# Patient Record
Sex: Female | Born: 1980 | Race: Black or African American | Hispanic: No | Marital: Single | Smoking: Former smoker
Health system: Southern US, Community
[De-identification: ages and names within clinical notes are randomized; demographics above are authoritative.]

## PROBLEM LIST (undated history)

## (undated) VITALS — BP 157/97 | HR 80 | Temp 98.3°F | Resp 18 | Ht 65.0 in | Wt 235.0 lb

## (undated) DIAGNOSIS — F419 Anxiety disorder, unspecified: Secondary | ICD-10-CM

## (undated) DIAGNOSIS — F32A Depression, unspecified: Secondary | ICD-10-CM

## (undated) DIAGNOSIS — I10 Essential (primary) hypertension: Secondary | ICD-10-CM

## (undated) DIAGNOSIS — F329 Major depressive disorder, single episode, unspecified: Secondary | ICD-10-CM

## (undated) HISTORY — PX: CHOLECYSTECTOMY: SHX55

## (undated) HISTORY — DX: Essential (primary) hypertension: I10

---

## 2002-09-04 ENCOUNTER — Encounter: Payer: Self-pay | Admitting: Emergency Medicine

## 2002-09-04 ENCOUNTER — Emergency Department (HOSPITAL_COMMUNITY): Admission: EM | Admit: 2002-09-04 | Discharge: 2002-09-04 | Payer: Self-pay | Admitting: Emergency Medicine

## 2007-09-28 ENCOUNTER — Encounter: Admission: RE | Admit: 2007-09-28 | Discharge: 2007-09-28 | Payer: Self-pay | Admitting: Gastroenterology

## 2009-02-01 ENCOUNTER — Other Ambulatory Visit: Admission: RE | Admit: 2009-02-01 | Discharge: 2009-02-01 | Payer: Self-pay | Admitting: *Deleted

## 2011-07-14 ENCOUNTER — Emergency Department (HOSPITAL_BASED_OUTPATIENT_CLINIC_OR_DEPARTMENT_OTHER)
Admission: EM | Admit: 2011-07-14 | Discharge: 2011-07-14 | Disposition: A | Discharge status: Home / Self Care | Payer: No Typology Code available for payment source | Attending: Emergency Medicine | Admitting: Emergency Medicine

## 2011-07-14 ENCOUNTER — Encounter: Payer: Self-pay | Admitting: *Deleted

## 2011-07-14 ENCOUNTER — Emergency Department (INDEPENDENT_AMBULATORY_CARE_PROVIDER_SITE_OTHER): Payer: No Typology Code available for payment source

## 2011-07-14 DIAGNOSIS — M542 Cervicalgia: Secondary | ICD-10-CM

## 2011-07-14 DIAGNOSIS — F17200 Nicotine dependence, unspecified, uncomplicated: Secondary | ICD-10-CM | POA: Insufficient documentation

## 2011-07-14 DIAGNOSIS — S139XXA Sprain of joints and ligaments of unspecified parts of neck, initial encounter: Secondary | ICD-10-CM | POA: Insufficient documentation

## 2011-07-14 DIAGNOSIS — S161XXA Strain of muscle, fascia and tendon at neck level, initial encounter: Secondary | ICD-10-CM

## 2011-07-14 DIAGNOSIS — Y92410 Unspecified street and highway as the place of occurrence of the external cause: Secondary | ICD-10-CM | POA: Insufficient documentation

## 2011-07-14 MED ORDER — CYCLOBENZAPRINE HCL 10 MG PO TABS: 10.0000 mg | ORAL_TABLET | Freq: Three times a day (TID) | ORAL | Status: AC | PRN

## 2011-07-14 MED ORDER — IBUPROFEN 800 MG PO TABS: 800.0000 mg | ORAL_TABLET | Freq: Three times a day (TID) | ORAL | Status: AC
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
## 2012-01-16 ENCOUNTER — Ambulatory Visit: Payer: Self-pay | Admitting: Internal Medicine

## 2012-01-16 VITALS — BP 142/86 | HR 98 | Temp 98.3°F | Resp 18 | Ht 65.0 in | Wt 255.0 lb

## 2012-01-16 DIAGNOSIS — L03319 Cellulitis of trunk, unspecified: Secondary | ICD-10-CM

## 2012-01-16 DIAGNOSIS — L03314 Cellulitis of groin: Secondary | ICD-10-CM

## 2012-01-16 DIAGNOSIS — L02219 Cutaneous abscess of trunk, unspecified: Secondary | ICD-10-CM

## 2012-01-16 MED ORDER — SULFAMETHOXAZOLE-TRIMETHOPRIM 800-160 MG PO TABS: 1.0000 | ORAL_TABLET | Freq: Two times a day (BID) | ORAL | Status: AC

## 2012-01-16 NOTE — Progress Notes (Signed)
  Subjective:    Patient ID: Cheryl Noble, female    DOB: Dec 14, 1980, 31 y.o.   MRN: [id number]  Groin Pain The patient's primary symptoms include vaginal bleeding. The patient's pertinent negatives include no genital itching, genital lesions, genital odor, genital rash, missed menses, pelvic pain or vaginal discharge. This is a new problem. The current episode started in the past 7 days. The problem occurs constantly. The problem has been gradually worsening. The pain is moderate. The problem affects the right side. She is not pregnant. Pertinent negatives include no abdominal pain, back pain, chills, discolored urine, fever, flank pain, rash, urgency or vomiting.  Cheryl Noble comes in today for a painful skin area in her right groin area.  She has had a small lump there for "some time" but in the past week it has become larger and is now painful.  She has not seen any drainage from the sight and has no associated symptoms.  She is on her menses today and denies pregnancy.  She has SSP.  Generally her health is good with no diabetes or other chronic illnesses.    Review of Systems  Constitutional: Negative for fever and chills.  Gastrointestinal: Negative for vomiting and abdominal pain.  Genitourinary: Negative for urgency, flank pain, vaginal discharge, pelvic pain and missed menses.  Musculoskeletal: Negative for back pain.  Skin: Negative for rash.  All other systems reviewed and are negative.       Objective:   Physical Exam  Vitals reviewed. Constitutional: She is oriented to person, place, and time. She appears well-developed and well-nourished.       obese  HENT:  Head: Normocephalic.  Eyes: Conjunctivae are normal.  Cardiovascular: Normal rate, regular rhythm and normal heart sounds.   Pulmonary/Chest: Effort normal and breath sounds normal.  Abdominal: Soft. There is no tenderness.  Genitourinary:    Pelvic exam was performed with patient supine.  Neurological: She is alert  and oriented to person, place, and time.  Skin: Skin is warm and dry.  Psychiatric: She has a normal mood and affect. Her behavior is normal.   Procedure:  I & D or right groin.  Topical lidocaine applied X 10 minutes.  Area prepped and cleansed with betadine and alcohol.  #15 blade used to open wound and copious yellow drainage was expressed.  Wound Cx obtained.  Telfa, gauze and tape dressing applied.  Wound instructions given to pt on AVS.  Septra DS BID for 10 days.  RTC if not resolving or problems arise.       Assessment & Plan:  Celllulits/Abcess or right groin.

## 2012-01-16 NOTE — Patient Instructions (Signed)
Take your Septra DS twice daily with food.  Take a warm bath for the next two days starting tomorrow to encourage drainage of any more infection.  Your wound should heal and the skin close in the next 2-3 days.  Change your dressing at least once daily until no more drainage is seen.  Return to our clinic if you have any other problems.  Cellulitis Cellulitis is an infection of the skin and the tissue beneath it. The area is typically red and tender. It is caused by germs (bacteria) (usually staph or strep) that enter the body through cuts or sores. Cellulitis most commonly occurs in the arms or lower legs.  HOME CARE INSTRUCTIONS   If you are given a prescription for medications which kill germs (antibiotics), take as directed until finished.   If the infection is on the arm or leg, keep the limb elevated as able.   Use a warm cloth several times per day to relieve pain and encourage healing.   See your caregiver for recheck of the infected site as directed if problems arise.   Only take over-the-counter or prescription medicines for pain, discomfort, or fever as directed by your caregiver.  SEEK MEDICAL CARE IF:   The area of redness (inflammation) is spreading, there are red streaks coming from the infected site, or if a part of the infection begins to turn dark in color.   The joint or bone underneath the infected skin becomes painful after the skin has healed.   The infection returns in the same or another area after it seems to have gone away.   A boil or bump swells up. This may be an abscess.   New, unexplained problems such as pain or fever develop.  SEEK IMMEDIATE MEDICAL CARE IF:   You have a fever.   You or your child feels drowsy or lethargic.   There is vomiting, diarrhea, or lasting discomfort or feeling ill (malaise) with muscle aches and pains.  MAKE SURE YOU:   Understand these instructions.   Will watch your condition.   Will get help right away if you are  not doing well or get worse.  Document Released: 08/14/2005 Document Revised: 07/17/2011 Document Reviewed: 06/22/2008 [facility] Patient Information 2012 [place], [place].

## 2012-01-18 LAB — WOUND CULTURE: Gram Stain: NONE SEEN

## 2014-01-11 ENCOUNTER — Ambulatory Visit (HOSPITAL_COMMUNITY): Payer: Self-pay | Admitting: Psychiatry

## 2014-01-14 ENCOUNTER — Encounter (HOSPITAL_COMMUNITY): Payer: Self-pay | Admitting: *Deleted

## 2014-01-14 ENCOUNTER — Inpatient Hospital Stay (HOSPITAL_COMMUNITY)
Admission: AD | Admit: 2014-01-14 | Discharge: 2014-01-17 | DRG: 885 | Disposition: A | Discharge status: Home / Self Care | Payer: 59 | Attending: Psychiatry | Admitting: Psychiatry

## 2014-01-14 DIAGNOSIS — F339 Major depressive disorder, recurrent, unspecified: Secondary | ICD-10-CM | POA: Diagnosis present

## 2014-01-14 DIAGNOSIS — R45851 Suicidal ideations: Secondary | ICD-10-CM

## 2014-01-14 DIAGNOSIS — R634 Abnormal weight loss: Secondary | ICD-10-CM | POA: Diagnosis present

## 2014-01-14 DIAGNOSIS — F411 Generalized anxiety disorder: Secondary | ICD-10-CM | POA: Diagnosis present

## 2014-01-14 DIAGNOSIS — F419 Anxiety disorder, unspecified: Secondary | ICD-10-CM | POA: Diagnosis present

## 2014-01-14 DIAGNOSIS — R630 Anorexia: Secondary | ICD-10-CM | POA: Diagnosis present

## 2014-01-14 DIAGNOSIS — F332 Major depressive disorder, recurrent severe without psychotic features: Principal | ICD-10-CM | POA: Diagnosis present

## 2014-01-14 DIAGNOSIS — IMO0002 Reserved for concepts with insufficient information to code with codable children: Secondary | ICD-10-CM

## 2014-01-14 DIAGNOSIS — Z6839 Body mass index (BMI) 39.0-39.9, adult: Secondary | ICD-10-CM

## 2014-01-14 HISTORY — DX: Major depressive disorder, single episode, unspecified: F32.9

## 2014-01-14 HISTORY — DX: Depression, unspecified: F32.A

## 2014-01-14 HISTORY — DX: Anxiety disorder, unspecified: F41.9

## 2014-01-14 MED ORDER — ACETAMINOPHEN 325 MG PO TABS
650.0000 mg | ORAL_TABLET | Freq: Four times a day (QID) | ORAL | Status: DC | PRN
  Administered 2014-01-15: 650 mg via ORAL
  Filled 2014-01-14: qty 2

## 2014-01-14 MED ORDER — HYDROXYZINE HCL 25 MG PO TABS
25.0000 mg | ORAL_TABLET | Freq: Four times a day (QID) | ORAL | Status: DC | PRN
  Administered 2014-01-15 – 2014-01-17 (×3): 25 mg via ORAL
  Filled 2014-01-14 (×5): qty 1
  Filled 2014-01-14: qty 10

## 2014-01-14 MED ORDER — TRAZODONE HCL 50 MG PO TABS
50.0000 mg | ORAL_TABLET | Freq: Every evening | ORAL | Status: DC | PRN
  Filled 2014-01-14: qty 1

## 2014-01-14 MED ORDER — ENSURE COMPLETE PO LIQD
237.0000 mL | Freq: Every day | ORAL | Status: DC
  Administered 2014-01-14 – 2014-01-16 (×2): 237 mL via ORAL

## 2014-01-14 MED ORDER — ALUM & MAG HYDROXIDE-SIMETH 200-200-20 MG/5ML PO SUSP: 30.0000 mL | ORAL | Status: DC | PRN

## 2014-01-14 MED ORDER — FLUOXETINE HCL 10 MG PO CAPS
10.0000 mg | ORAL_CAPSULE | Freq: Every day | ORAL | Status: DC
  Administered 2014-01-14 – 2014-01-17 (×4): 10 mg via ORAL
  Filled 2014-01-14: qty 7
  Filled 2014-01-14 (×6): qty 1

## 2014-01-14 MED ORDER — MAGNESIUM HYDROXIDE 400 MG/5ML PO SUSP: 30.0000 mL | Freq: Every day | ORAL | Status: DC | PRN

## 2014-01-14 NOTE — Progress Notes (Signed)
[facility] Group Notes:  (Nursing/MHT/Case Management/Adjunct)  Date:  01/14/2014  Time:  8:00 p.m.   Type of Therapy:  Psychoeducational Skills  Participation Level:  Active  Participation Quality:  Appropriate  Affect:  Appropriate  Cognitive:  Appropriate  Insight:  Appropriate  Engagement in Group:  Engaged  Modes of Intervention:  Education  Summary of Progress/Problems: The patient described her day as having been pretty good overall. She indicated that she went to the gym and attended some groups. As a theme for the day, her relapse prevention will include getting more exercise.   Hazle CocaGOODMAN, Lashandra Arauz S 01/14/2014, 10:47 PM
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
## 2014-01-14 NOTE — Progress Notes (Signed)
Nutrition Brief Note  Patient identified on the Malnutrition Screening Tool (MST) Report.  Wt Readings from Last 10 Encounters:  01/14/14 235 lb (106.595 kg)  01/16/12 255 lb (115.667 kg)  07/14/11 251 lb (113.853 kg)   Body mass index is 39.11 kg/(m^2). Patient meets criteria for class II obesity based on current BMI.   Discussed intake PTA with patient and compared to intake presently.  Discussed changes in intake, if any, and encouraged adequate intake of meals and snacks.  Admitted with depression, also c/o decreased appetite and sleep disturbance. Met with pt who reports poor appetite has been going on for months with 15 pound unintended weight loss in the past 3 months. Pt reports sporadic intake, some days she will eat well, other days less. Thinks this is due to stress and depression. Interested in getting Ensure for additional nutrition.   Current diet order is regular and pt is also offered choice of unit snacks mid-morning and mid-afternoon.  Pt is eating as desired.   Labs and medications reviewed.   Nutrition Dx:  Unintended wt change r/t suboptimal oral intake AEB pt report  Interventions:   Discussed the importance of nutrition and encouraged intake of food and beverages.     Supplements: Ensure Complete HS  No additional nutrition interventions warranted at this time. If nutrition issues arise, please consult RD.   Mikey College MS, [place], [place] Pager [phone] After Hours Pager
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
## 2014-01-14 NOTE — Progress Notes (Signed)
Patient ID: Cheryl Noble, female   DOB: [birth year], 33 y.o.   MRN: [id number] Pt states that she has been having increased anxiety and depression over the past couple of months, denies SI/HI/AVH currently, contracts for safety, main stressors-recently finished masters degree and started working, recent break up, c/o decreased appetite and sleep disturbance, states that she needs someone to inform her employer that she will not be at work, notified LCSW, oriented pt to unit and rules, pt was anxious but cooperative during admission process.

## 2014-01-14 NOTE — Progress Notes (Signed)
Patient ID: Cheryl Noble, female   DOB: Jan 03, 1981, 33 y.o.   MRN: [id number] D: Patient reports increased anxiety. Pt reports increase anxiety from her job with upcoming job opening and recent breakup with partner. Pt state "I feel like  I'm not good enough". Pt rates anxiety as 4 on 0-10 scale. Pt denies suicidal /homicidal ideation intent and plan. Pt denies auditory and visual hallucination. Pt attended evening wrap up group and engaged in discussion. Pt denies any needs or concerns. Cooperative with assessment. No acute distressed noted at this time.   A: Met with pt 1:1. Medications administered as prescribed. Writer encouraged pt to discuss feelings. Pt encouraged to come to staff with any questions or concerns.   R: Patient is safe on the unit. She is complaint with medications and denies any adverse reaction. Continue current POC.

## 2014-01-14 NOTE — [facility] Suicide Risk Assessment (Signed)
[facility] INPATIENT:  Family/Significant Other Suicide Prevention Education  Suicide Prevention Education:  Education Completed; Cheryl Noble, Friend, [phone];  has been identified by the patient as the family member/significant other with whom the patient will be residing, and identified as the person(s) who will aid the patient in the event of a mental health crisis (suicidal ideations/suicide attempt).  With written consent from the patient, the family member/significant other has been provided the following suicide prevention education, prior to the and/or following the discharge of the patient.  The suicide prevention education provided includes the following:  Suicide risk factors  Suicide prevention and interventions  National Suicide Hotline telephone number  [facility] assessment telephone number  [facility] Emergency Assistance 911  [facility] and/or Residential Mobile Crisis Unit telephone number  Request made of family/significant other to:  Remove weapons (e.g., guns, rifles, knives), all items previously/currently identified as safety concern.  Friend advised patient does not have access to guns.  Remove drugs/medications (over-the-counter, prescriptions, illicit drugs), all items previously/currently identified as a safety concern.  The family member/significant other verbalizes understanding of the suicide prevention education information provided.  The family member/significant other agrees to remove the items of safety concern listed above.  Cheryl Noble, Cheryl Noble 01/14/2014, 3:35 PM

## 2014-01-14 NOTE — Progress Notes (Signed)
Recreation Therapy Notes  Date: 02.27.2015 Time: 2:45pm Location: [address] Dayroom    Group Topic: Communication, Team Building, Problem Solving  Goal Area(s) Addresses:  Patient will effectively work with peer towards shared goal.  Patient will identify skill used to make activity successful.  Patient will identify how skills used during activity can be used to build healthy support system.   Behavioral Response: Did not attend.   Marykay Lexenise L Cristy Colmenares, LRT/CTRS  Jearl KlinefelterBlanchfield, Jillisa Harris L 01/14/2014 4:23 PM

## 2014-01-15 ENCOUNTER — Inpatient Hospital Stay (HOSPITAL_COMMUNITY)
Admission: AD | Admit: 2014-01-15 | Discharge: 2014-01-15 | Disposition: A | Discharge status: Home / Self Care | Payer: 59 | Source: Home / Self Care | Attending: Psychiatry | Admitting: Psychiatry

## 2014-01-15 ENCOUNTER — Encounter (HOSPITAL_COMMUNITY): Payer: Self-pay | Admitting: Psychiatry

## 2014-01-15 MED ORDER — IBUPROFEN 200 MG PO TABS
400.0000 mg | ORAL_TABLET | ORAL | Status: DC | PRN
  Administered 2014-01-15 – 2014-01-16 (×4): 400 mg via ORAL
  Filled 2014-01-15 (×4): qty 2

## 2014-01-15 NOTE — Progress Notes (Signed)
[facility] Group Notes:  (Nursing/MHT/Case Management/Adjunct)  Date:  01/15/2014  Time:  8:00 p.m.   Type of Therapy:  Psychoeducational Skills  Participation Level:  Minimal  Participation Quality:  Attentive  Affect:  Appropriate  Cognitive:  Appropriate  Insight:  Appropriate  Engagement in Group:  Resistant  Modes of Intervention:  Education  Summary of Progress/Problems: The patient shared in group that she had a good day, but would not provide any further details. Her coping skill is to use less negative talking in her life and to get more exercise.   Hazle CocaGOODMAN, Ledarrius Beauchaine S 01/15/2014, 10:12 PM

## 2014-01-15 NOTE — Progress Notes (Signed)
Writer has observed patient up in the dayroom in a wheelchair watching tv with little interaction with peers.When not in the dayroom she has been observed in her room lying on her bed reading.  She attended group this evening and participated. She is cautious of taking medications but requested ibuprofen for calf pain. She voiced no other complaints, ice packs given to help with pain. She denies si/hi/a/v hallucinations. Safety maintained on unit with 15 min checks.
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
## 2014-01-15 NOTE — [facility] Group Notes (Signed)
[facility] Group Notes:  (Clinical Social Work)  01/15/2014   1:15-2:15PM  Summary of Progress/Problems:   The main focus of today's process group was for the patient to identify ways in which they have sabotaged their own mental health wellness/recovery.  Motivational interviewing and a handout were used to explore the benefits and costs of their self-sabotaging behavior as well as the benefits and costs of changing this behavior.  The Stages of Change were explained to the group using a handout, and it was explained to patients the importance of making specific plans for how to handle self-sabotaging behaviors. The patient expressed she self-sabotages with negative self-talk and isolation/withdrawal.  The benefit of changing this self-defeating behavior would be that she could function better at work, could go through her day not feeling judged, and could actually engage in fun activities.  Type of Therapy:  Process Group  Participation Level:  Active  Participation Quality:  Attentive and Sharing  Affect:  Appropriate  Cognitive:  Appropriate and Oriented  Insight:  Engaged  Engagement in Therapy:  Engaged  Modes of Intervention:  Education, Motivational Interviewing   Cheryl MantleMareida Grossman-Orr, LCSW 01/15/2014, 4:00pm
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
## 2014-01-15 NOTE — H&P (Signed)
  Pt was seen by me today and I agree with the key elements documented in H&P.  

## 2014-01-15 NOTE — Progress Notes (Signed)
D) Pt has attended the program and interacts with her peers. Pt has been complaining of pain in her left leg and was sent to the ED for evaluation. Results were no break or fracture. Affect and mood appear improved. Rates her depression at a 3 and her hopelessness at a 1. Denies SI and HI. Has attended the groups and participates in them. A) Given support and reassurance along with praise. Encouragement given. R) Denies SI and HI.

## 2014-01-16 DIAGNOSIS — F411 Generalized anxiety disorder: Secondary | ICD-10-CM

## 2014-01-16 DIAGNOSIS — R45851 Suicidal ideations: Secondary | ICD-10-CM

## 2014-01-16 NOTE — Progress Notes (Signed)
Psychoeducational Group Note  Date: 01/16/2014 Time:  0930  Group Topic/Focus:  Gratefulness:  The focus of this group is to help patients identify what two things they are most grateful for in their lives. What helps ground them and to center them on their work to their recovery.  Participation Level:  Active  Participation Quality:  Appropriate  Affect:  Appropriate  Cognitive:  Oriented  Insight:  improving  Engagement in Group:  Engaged  Additional Comments:    Cheryl Noble A   

## 2014-01-16 NOTE — [facility] Group Notes (Signed)
[facility] Group Notes:  (Clinical Social Work)  01/16/2014   1:15-2:15PM  Summary of Progress/Problems:  The main focus of today's process group was to   identify the patient's current support system and decide on other supports that can be put in place.  The picture on workbook was used to discuss why additional supports are needed.  An emphasis was placed on using counselor, doctor, therapy groups, 12-step groups, and problem-specific support groups to expand supports.   There was also an extensive discussion about what constitutes a healthy support versus an unhealthy support.  The patient expressed full comprehension of the concepts presented, and agreed that there is a need to add more supports.    Type of Therapy:  Process Group  Participation Level:  Minimal  Participation Quality:  Attentive   Affect:  Blunted  Cognitive:  Appropriate and Oriented  Insight:  Developing/Improving  Engagement in Therapy:  Improving  Modes of Intervention:  Education,  Support and Processing  Ambrose MantleMareida Grossman-Orr, LCSW 01/16/2014, 4:00pm

## 2014-01-16 NOTE — Progress Notes (Signed)
Psychoeducational Group Note  Date: 01/16/2014 Time:  0930  Group Topic/Focus:  Gratefulness:  The focus of this group is to help patients identify what two things they are most grateful for in their lives. What helps ground them and to center them on their work to their recovery.  Participation Level:  Active  Participation Quality:  Appropriate  Affect:  Appropriate  Cognitive:  Oriented  Insight:  improving  Engagement in Group:  Engaged  Additional Comments:    Deandre Stansel A   

## 2014-01-16 NOTE — Progress Notes (Signed)
Patient has been up and active on the unit, attended group this evening and participated. Patient currently denies having pain, -si/hi/a/v hall. She is hopeful to discharge on tomorrow. She has been up walking without the wheelchair today and is doing much better. Support and encouragement given, safety maintained on unit, will continue to monitor.

## 2014-01-16 NOTE — Progress Notes (Signed)
Late Entry for 01-15-14 Entered on 01-16-14  .Psychoeducational Group Note    Date: 01/16/2014 Time: 0930   Goal Setting Purpose of Group: To be able to set a goal that is measurable and that can be accomplished in one day Participation Level:  Active  Participation Quality:  Appropriate  Affect:  Appropriate  Cognitive:  Oriented  Insight:  Improving  Engagement in Group:  Engaged  Additional Comments:    Adrine Hayworth A 

## 2014-01-16 NOTE — Progress Notes (Signed)
Late entry for 01-15-14 Entered on 01-16-14  Psychoeducational Group Note  Date: 01/16/2014 Time:  1015  Group Topic/Focus:  Identifying Needs:   The focus of this group is to help patients identify their personal needs that have been historically problematic and identify healthy behaviors to address their needs.  Participation Level:  Active  Participation Quality:  Appropriate  Affect:  Appropriate  Cognitive:  Oriented  Insight:  Improving  Engagement in Group:  Engaged  Additional Comments:    Willa Brocks A  

## 2014-01-16 NOTE — Progress Notes (Signed)
D) Pt states that she is going to apply for the job at [employer] this week. Feels much more empowered about herself and feels that she wants and should get the position she has been so concerned about. Has attended the groups and interacts with her peers. 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  A) Given support, reassurance and praise. Praised for her willingness to reach out and risk. R) Denies SI and HI.

## 2014-01-16 NOTE — Progress Notes (Signed)
[facility] MD Progress Note  01/16/2014 3:47 PM Cheryl Noble  MRN:  [id number] Subjective:  Cheryl Noble states that she is feeling less depressed today denies current suicidal ideation or plan.  Patient states that SI attempt was impulsive and she should have used coping skills and stress management techniques.  "i know what I should have done, I just didn't access my resources"  Develops safety plan to contact support systems including roommate, and friends.  Continue current treatment plan, no changes made to medication regimen.   Diagnosis:   DSM5:  Trauma-Stressor Disorders:  Posttraumatic Stress Disorder (309.81)  Depressive Disorders:  Major Depressive Disorder - Severe (296.23) Total Time spent with patient: 30 minutes  Axis I: Social Anxiety Axis II: No diagnosis Axis III:  Past Medical History  Diagnosis Date  . Depression   . Anxiety     social anxiety   Axis IV: economic problems and occupational problems Axis V: 51-60 moderate symptoms  ADL's:  Intact  Sleep: Fair  Appetite:  Good  Suicidal Ideation:  Passive SI Homicidal Ideation:  Denies   Psychiatric Specialty Exam: Physical Exam  Constitutional: She is oriented to person, place, and time. She appears well-developed and well-nourished.  HENT:  Head: Normocephalic and atraumatic.  Musculoskeletal: She exhibits tenderness.  Neurological: She is alert and oriented to person, place, and time.  Skin: Skin is warm and dry.    Review of Systems  Constitutional: Negative.   HENT: Negative.   Eyes: Negative.   Respiratory: Negative.   Cardiovascular: Negative.   Gastrointestinal: Negative.   Genitourinary: Negative.   Musculoskeletal: Positive for joint pain and myalgias.  Skin: Negative.   Neurological: Negative.   Endo/Heme/Allergies: Negative.   Psychiatric/Behavioral: Positive for depression.    Blood pressure 135/95, pulse 92, temperature 98.2 F (36.8 C), temperature source Oral, resp. rate 18, height 5\' 5"   (1.651 m), weight 106.595 kg (235 lb), last menstrual period 01/13/2014, SpO2 98.00%.Body mass index is 39.11 kg/(m^2).  General Appearance: Casual and Well Groomed  Eye Contact::  Good  Speech:  Clear and Coherent and Normal Rate  Volume:  Normal  Mood:  Depressed  Affect:  Appropriate  Thought Process:  Coherent and Goal Directed  Orientation:  Full (Time, Place, and Person)  Thought Content:  WDL  Suicidal Thoughts:  Passive SI  Homicidal Thoughts:  No  Memory:  Immediate;   Good Recent;   Good Remote;   Good  good recall  Judgement:  Fair  Insight:  Fair  Psychomotor Activity:  Normal  Concentration:  Good  Recall:  Good  Fund of Knowledge:Good  Language: Good  Akathisia:  No  Handed:  Right  AIMS (if indicated):     Assets:  Communication Skills Desire for Improvement Physical Health Social Support Talents/Skills  Sleep:  Number of Hours: 6.75   Musculoskeletal: Strength & Muscle Tone: abnormal and has slight limp to right leg following injury  Gait & Station: limping following basketball injury 8/28 Patient leans: N/A  Current Medications: Current Facility-Administered Medications  Medication Dose Route Frequency Provider Last Rate Last Dose  . alum & mag hydroxide-simeth (MAALOX/MYLANTA) 200-200-20 MG/5ML suspension 30 mL  30 mL Oral Q4H PRN Nanine Means, NP      . feeding supplement (ENSURE COMPLETE) (ENSURE COMPLETE) liquid 237 mL  237 mL Oral QHS Lavena Bullion, RD   237 mL at 01/14/14 1959  . FLUoxetine (PROZAC) capsule 10 mg  10 mg Oral Daily Nanine Means, NP   10 mg at  01/16/14 0826  . hydrOXYzine (ATARAX/VISTARIL) tablet 25 mg  25 mg Oral Q6H PRN Nanine MeansJamison Rosland Riding, NP   25 mg at 01/15/14 2110  . ibuprofen (ADVIL,MOTRIN) tablet 400 mg  400 mg Oral Q4H PRN Nanine MeansJamison Jmichael Gille, NP   400 mg at 01/16/14 1054  . magnesium hydroxide (MILK OF MAGNESIA) suspension 30 mL  30 mL Oral Daily PRN Nanine MeansJamison Kree Armato, NP      . traZODone (DESYREL) tablet 50 mg  50 mg Oral QHS PRN Nanine MeansJamison  Henna Derderian, NP        Lab Results: No results found for this or any previous visit (from the past 48 hour(s)).  Physical Findings: AIMS: Facial and Oral Movements Muscles of Facial Expression: None, normal Lips and Perioral Area: None, normal Jaw: None, normal Tongue: None, normal,Extremity Movements Upper (arms, wrists, hands, fingers): None, normal Lower (legs, knees, ankles, toes): None, normal, Trunk Movements Neck, shoulders, hips: None, normal, Overall Severity Severity of abnormal movements (highest score from questions above): None, normal Incapacitation due to abnormal movements: None, normal Patient's awareness of abnormal movements (rate only patient's report): No Awareness, Dental Status Current problems with teeth and/or dentures?: No Does patient usually wear dentures?: No  CIWA:    COWS:     Treatment Plan Summary: Daily contact with patient to assess and evaluate symptoms and progress in treatment Medication management  Plan:  Review of chart, vital signs, medications, and notes. 1-Individual and group therapy 2-Medication management for depression and anxiety:  Medications reviewed with the patient and she stated no untoward effects, no changes made 3-Coping skills for depression, anxiety, and pain 4-Continue crisis stabilization and management 5-Address health issues--monitoring vital signs, stable 6-Treatment plan in progress to prevent relapse of depression and anxiety  Medical Decision Making Problem Points:  Established problem, stable/improving (1) and Review of psycho-social stressors (1) Data Points:  Review of medication regiment & side effects (2)  I certify that inpatient services furnished can reasonably be expected to improve the patient's condition.   Nanine MeansLORD, Sway Guttierrez - PM[facility] - NP  01/16/2014, 3:47 PM

## 2014-01-17 DIAGNOSIS — F411 Generalized anxiety disorder: Secondary | ICD-10-CM

## 2014-01-17 DIAGNOSIS — F332 Major depressive disorder, recurrent severe without psychotic features: Principal | ICD-10-CM

## 2014-01-17 MED ORDER — HYDROXYZINE HCL 25 MG PO TABS: 25.0000 mg | ORAL_TABLET | Freq: Four times a day (QID) | ORAL | Status: DC | PRN

## 2014-01-17 MED ORDER — FLUOXETINE HCL 10 MG PO CAPS: 10.0000 mg | ORAL_CAPSULE | Freq: Every day | ORAL | Status: DC

## 2014-01-17 NOTE — [facility] Group Notes (Signed)
[facility]   01/17/2014 11:16 AM    Participation Quality:  Appropraite  Mood/Affect:  Appropriate  Depression Rating:  1  Anxiety Rating:  3  Thoughts of Suicide:  No  Will you contract for safety?   NA  Current AVH:  No  Plan for Discharge/Comments:  Patient attended discharge planning group and actively participated in group.  She will return to her home and [facility] CSW provided all participants with daily workbook.   Transportation Means: Patient has transportation.   Supports:  Patient has a support system.   Cheryl Noble, Cheryl Noble

## 2014-01-17 NOTE — Progress Notes (Signed)
Discharge Note:  Patient discharged home, has keys to her car in parking lot.  Denied SI and HI.   Denied A/V hallucinations.  Denied pain.  Patient received all her belongings, clothing, toiletries, miscellaneous items, keys, sunglasses, cello phone, shoe laces, hair tie, belt, cards.  Suicide prevention information given to patient, reviewed with patient, who stated she understood and had no questions.  Patient stated she [facility].

## 2014-01-17 NOTE — Progress Notes (Addendum)
Patient requested vistaril for anxiety this morning.  Nurse got vistaril out of pyxis, dropped medication on floor.  Another vistaril was obtained from pyxis and given to patient.  Annabelle HarmanDana RN witnessed vistaril which was dropped on floor disposed of properly.  D:  Patient's self inventory sheet, patient denied SI and HI.  Contracts for safety.  Rated depression 1, anxiety 5, hopeless 1.   A:  Medications administered per MD orders.  Emotional support and encouragement given patient. R:  Denied SI and HI.  Denied A/V hallucinations.  Will continue to monitor patient for safety with 15 minute checks.  Safety maintained.

## 2014-01-17 NOTE — Progress Notes (Signed)
[facility] Group Notes:  (Nursing/MHT/Case Management/Adjunct)  Date:  01/17/2014  Time:  8:00 p.m.   Type of Therapy:  Psychoeducational Skills  Participation Level:  Minimal  Participation Quality:  Attentive  Affect:  Appropriate  Cognitive:  Lacking  Insight:  Limited  Engagement in Group:  Poor  Modes of Intervention:  Education  Summary of Progress/Problems: The patient shared with the group this evening that she felt very drowsy today. Her support system will be comprised of her friends and family.   [name] S 01/17/2014, 12:06 AM

## 2014-01-17 NOTE — Tx Team (Signed)
Interdisciplinary Treatment Plan Update   Date Reviewed:  01/17/2014  Time Reviewed:  10:51 AM  Progress in Treatment:   Attending groups: Yes Participating in groups: Yes Taking medication as prescribed: Yes  Tolerating medication: Yes Family/Significant other contact made:  Yes, collateral contact with friend Patient understands diagnosis: Yes  Discussing patient identified problems/goals with staff: Yes Medical problems stabilized or resolved: Yes Denies suicidal/homicidal ideation: Yes Patient has not harmed self or others: Yes  For review of initial/current patient goals, please see plan of care.  Estimated Length of Stay:  Discharge today  Reasons for Continued Hospitalization:   New Problems/Goals identified:    Discharge Plan or Barriers:   Home with [facility]  Additional Comments:  Attendees:  Patient: Cheryl Noble 01/17/2014 10:51 AM   Signature: Mervyn GayJ. Jonnalagadda, MD 01/17/2014 10:51 AM  Signature:   01/17/2014 10:51 AM  Signature:  Claudette Headonrad Withrow, NP 01/17/2014 10:51 AM  Signature:Beverly Terrilee CroakKnight, RN 01/17/2014 10:51 AM  Signature:  Neill Loftarol Davis RN 01/17/2014 10:51 AM  Signature:  Juline PatchQuylle Brynlynn Walko, LCSW 01/17/2014 10:51 AM  Signature:  Reyes Ivanhelsea Horton, LCSW 01/17/2014 10:51 AM  Signature:  Leisa LenzValerie Enoch, Care Coordinator [facility] 01/17/2014 10:51 AM  Signature:  Cephas DarbyXavier Pandimakeel. LCSW  01/17/2014 10:51 AM  Signature: Nadean CorwinKim Maggio, RN 01/17/2014  10:51 AM  Signature:   Onnie BoerJennifer Clark, RN [facility] 01/17/2014  10:51 AM  Signature:  Chrisandra Nettersana Green, RN 01/17/2014  10:51 AM    Scribe for Treatment Team:   Juline PatchQuylle Saim Almanza,  01/17/2014 10:51 AM

## 2014-01-17 NOTE — [facility] Suicide Risk Assessment (Signed)
   Demographic Factors:  Adolescent or young adult  Total Time spent with patient: 30 minutes  Psychiatric Specialty Exam: Physical Exam  Constitutional: She is oriented to person, place, and time. She appears well-developed.  HENT:  Head: Normocephalic.  Eyes: Pupils are equal, round, and reactive to light.  Neck: Normal range of motion.  Cardiovascular: Normal rate.   Respiratory: Effort normal.  GI: Soft.  Musculoskeletal: Normal range of motion.  Neurological: She is alert and oriented to person, place, and time.  Skin: Skin is warm.    Review of Systems  All other systems reviewed and are negative.    Blood pressure 157/97, pulse 80, temperature 98.3 F (36.8 C), temperature source Oral, resp. rate 18, height 5\' 5"  (1.651 m), weight 106.595 kg (235 lb), last menstrual period 01/13/2014, SpO2 98.00%.Body mass index is 39.11 kg/(m^2).  General Appearance: Casual  Eye Contact::  Good  Speech:  Clear and Coherent  Volume:  Normal  Mood:  Euthymic  Affect:  Appropriate and Congruent  Thought Process:  Goal Directed and Intact  Orientation:  Full (Time, Place, and Person)  Thought Content:  WDL  Suicidal Thoughts:  No  Homicidal Thoughts:  No  Memory:  Immediate;   Good  Judgement:  Good  Insight:  Good  Psychomotor Activity:  Normal  Concentration:  Good  Recall:  Good  Fund of Knowledge:Good  Language: Good  Akathisia:  NA  Handed:  Right  AIMS (if indicated):     Assets:  Communication Skills Desire for Improvement Financial Resources/Insurance Housing Leisure Time Physical Health Resilience Social Support Talents/Skills Transportation Vocational/Educational  Sleep:  Number of Hours: 6.75    Musculoskeletal: Strength & Muscle Tone: within normal limits Gait & Station: normal Patient leans: N/A   Mental Status Per Nursing Assessment::   On Admission:  NA  Current Mental Status by Physician: NA  Loss Factors: Loss of significant  relationship  Historical Factors: Family history of mental illness or substance abuse and Impulsivity  Risk Reduction Factors:   Sense of responsibility to family, Religious beliefs about death, Employed, Living with another person, especially a relative, Positive social support, Positive therapeutic relationship and Positive coping skills or problem solving skills  Continued Clinical Symptoms:  Depression:   Recent sense of peace/wellbeing Unstable or Poor Therapeutic Relationship Previous Psychiatric Diagnoses and Treatments  Cognitive Features That Contribute To Risk:  Polarized thinking    Suicide Risk:  Minimal: No identifiable suicidal ideation.  Patients presenting with no risk factors but with morbid ruminations; may be classified as minimal risk based on the severity of the depressive symptoms  Discharge Diagnoses:   AXIS I:  Anxiety Disorder NOS and Major Depression, Recurrent severe AXIS II:  Deferred AXIS III:   Past Medical History  Diagnosis Date  . Depression   . Anxiety     social anxiety   AXIS IV:  other psychosocial or environmental problems, problems related to social environment, problems with primary support group and Broke up with a partner AXIS V:  61-70 mild symptoms  Plan Of Care/Follow-up recommendations:  Activity:  As tolerated Diet:  Regular  Is patient on multiple antipsychotic therapies at discharge:  No   Has Patient had three or more failed trials of antipsychotic monotherapy by history:  No  Recommended Plan for Multiple Antipsychotic Therapies: NA    Cheryl Noble,JANARDHAHA R. 01/17/2014, 1:01 PM

## 2014-01-17 NOTE — [facility] Group Notes (Signed)
[facility]   01/17/2014 12:52 PM    Participation Quality:  Appropraite  Mood/Affect:  Appropriate  Depression Rating:  1  Anxiety Rating:  3  Thoughts of Suicide:  No  Will you contract for safety?   NA  Current AVH:  No  Plan for Discharge/Comments:  Patient attended discharge planning group and actively participated in group.  She plans to return to her home [facility]  CSW provided all participants with daily workbook.   Transportation Means: Patient has transportation.   Supports:  Patient has a support system.   Cheryl Noble, Cheryl Noble
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
## 2014-01-19 NOTE — Progress Notes (Signed)
Patient Discharge Instructions:  After Visit Summary (AVS):   Faxed to:  01/19/14 Discharge Summary Note:   Faxed to:  01/19/14 Psychiatric Admission Assessment Note:   Faxed to:  01/19/14 Suicide Risk Assessment - Discharge Assessment:   Faxed to:  01/19/14 Faxed/Sent to the Next Level Care provider:  01/19/14 Next Level Care Provider Has Access to the EMR, 01/19/14 Records [facility] via [facility]/Epic access Faxed to [facility] @ [phone]  Jerelene ReddenSheena E [place], 01/19/2014, 4:21 PM

## 2014-01-28 ENCOUNTER — Encounter (HOSPITAL_COMMUNITY): Payer: Self-pay | Admitting: Psychiatry

## 2014-01-28 ENCOUNTER — Ambulatory Visit (INDEPENDENT_AMBULATORY_CARE_PROVIDER_SITE_OTHER): Payer: 59 | Admitting: Psychiatry

## 2014-01-28 VITALS — BP 120/80 | HR 78 | Ht 66.0 in | Wt 235.0 lb

## 2014-01-28 DIAGNOSIS — F4310 Post-traumatic stress disorder, unspecified: Secondary | ICD-10-CM

## 2014-01-28 DIAGNOSIS — F332 Major depressive disorder, recurrent severe without psychotic features: Secondary | ICD-10-CM

## 2014-01-28 DIAGNOSIS — E669 Obesity, unspecified: Secondary | ICD-10-CM | POA: Insufficient documentation

## 2014-01-28 DIAGNOSIS — F411 Generalized anxiety disorder: Secondary | ICD-10-CM

## 2014-01-28 DIAGNOSIS — F419 Anxiety disorder, unspecified: Secondary | ICD-10-CM

## 2014-01-28 DIAGNOSIS — F339 Major depressive disorder, recurrent, unspecified: Secondary | ICD-10-CM

## 2014-01-28 MED ORDER — HYDROXYZINE HCL 25 MG PO TABS: 25.0000 mg | ORAL_TABLET | Freq: Four times a day (QID) | ORAL | Status: DC | PRN

## 2014-01-28 MED ORDER — FLUOXETINE HCL 10 MG PO CAPS: 10.0000 mg | ORAL_CAPSULE | Freq: Every day | ORAL | Status: DC
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
## 2014-01-31 ENCOUNTER — Ambulatory Visit (HOSPITAL_COMMUNITY): Payer: Self-pay | Admitting: Psychiatry

## 2014-02-07 ENCOUNTER — Telehealth (HOSPITAL_COMMUNITY): Payer: Self-pay | Admitting: *Deleted

## 2014-02-07 NOTE — Telephone Encounter (Signed)
Refill not appropriate filled 3/13 with 2 refills, pharmacy was notified.

## 2014-02-09 ENCOUNTER — Telehealth: Payer: Self-pay | Admitting: *Deleted

## 2014-03-31 ENCOUNTER — Other Ambulatory Visit (HOSPITAL_COMMUNITY): Payer: Self-pay | Admitting: *Deleted

## 2014-03-31 MED ORDER — HYDROXYZINE HCL 25 MG PO TABS: 25.0000 mg | ORAL_TABLET | Freq: Four times a day (QID) | ORAL | Status: DC | PRN

## 2014-05-02 ENCOUNTER — Ambulatory Visit (HOSPITAL_COMMUNITY): Payer: Self-pay | Admitting: Psychiatry

## 2014-05-03 ENCOUNTER — Ambulatory Visit (INDEPENDENT_AMBULATORY_CARE_PROVIDER_SITE_OTHER): Payer: 59 | Admitting: Psychiatry

## 2014-05-03 ENCOUNTER — Encounter (HOSPITAL_COMMUNITY): Payer: Self-pay | Admitting: Psychiatry

## 2014-05-03 VITALS — BP 158/98 | HR 68 | Ht 66.0 in | Wt 224.4 lb

## 2014-05-03 DIAGNOSIS — F331 Major depressive disorder, recurrent, moderate: Secondary | ICD-10-CM

## 2014-05-03 MED ORDER — FLUOXETINE HCL 20 MG PO CAPS: 20.0000 mg | ORAL_CAPSULE | Freq: Every day | ORAL | Status: DC

## 2014-05-03 MED ORDER — HYDROXYZINE HCL 10 MG PO TABS: 10.0000 mg | ORAL_TABLET | Freq: Four times a day (QID) | ORAL | Status: DC | PRN

## 2014-05-03 NOTE — Progress Notes (Signed)
   [facility]  Cheryl Noble January 19, 1981  Date:  05/03/14  Subjective: pt is here for follow up.  Sleeping is good; appetite is fair to poor. Mood is good. She denies SI/HI/AVH. Tolerating the medications. Depression 0/10, Anxiety 7/10. She like hydroxyzine, but wants to try 10 mg po QD. She feels dependent on the medication. Pt is well groomed, just got hair done. She appears still constricted.   There were no vitals filed for this visit.  Mental Status Examination  Appearance: casual  Alert: Yes Attention: fair  Cooperative: Yes Eye Contact: Fair Speech: slow  Psychomotor Activity: Psychomotor Retardation Memory/Concentration: fair  Oriented: time/date, situation and day of week Mood: Anxious, Depressed and Dysphoric Affect: Appropriate and Congruent Thought Processes and Associations: Goal Directed and Linear Fund of Knowledge: Fair Thought Content: preoccupations Insight: Fair Judgement: Fair  Diagnosis:  MDD, recurrent, moderate   Treatment Plan:  Rtc in 4 months Fluoxetine 20 mg po daily for depression Hydroxyzine 10 mg po tid prn anxiety Kendrick FriesBLANKMANN, MEGHAN, NP

## 2014-09-05 ENCOUNTER — Ambulatory Visit (HOSPITAL_COMMUNITY): Payer: Self-pay | Admitting: Psychiatry

## 2014-09-15 ENCOUNTER — Other Ambulatory Visit (HOSPITAL_COMMUNITY): Payer: Self-pay | Admitting: *Deleted

## 2014-09-15 DIAGNOSIS — F331 Major depressive disorder, recurrent, moderate: Secondary | ICD-10-CM

## 2014-09-15 MED ORDER — FLUOXETINE HCL 20 MG PO CAPS: 20.0000 mg | ORAL_CAPSULE | Freq: Every day | ORAL | Status: DC

## 2014-09-15 NOTE — Telephone Encounter (Signed)
Chart reviewed. Refill appropriate per Dr. Lucianne MussKumar. Patient has appt 11/3 with new provider in office - as previous provider left.

## 2014-09-16 ENCOUNTER — Telehealth (HOSPITAL_COMMUNITY): Payer: Self-pay | Admitting: *Deleted

## 2014-09-16 NOTE — Telephone Encounter (Signed)
Called stating her pharmacy does not have refill on file and she needs it today. Called [facility] and they state a fax was not received. Explained it was sent through escripts, they looked it up and apologized stating it will be filled today. Patient notified.

## 2014-09-20 ENCOUNTER — Encounter (HOSPITAL_COMMUNITY): Payer: Self-pay | Admitting: Psychiatry

## 2014-09-20 ENCOUNTER — Ambulatory Visit (INDEPENDENT_AMBULATORY_CARE_PROVIDER_SITE_OTHER): Payer: 59 | Admitting: Psychiatry

## 2014-09-20 VITALS — BP 162/98 | HR 72 | Ht 66.0 in | Wt 235.4 lb

## 2014-09-20 DIAGNOSIS — F4010 Social phobia, unspecified: Secondary | ICD-10-CM

## 2014-09-20 DIAGNOSIS — F4310 Post-traumatic stress disorder, unspecified: Secondary | ICD-10-CM

## 2014-09-20 DIAGNOSIS — F331 Major depressive disorder, recurrent, moderate: Secondary | ICD-10-CM | POA: Insufficient documentation

## 2014-09-20 MED ORDER — HYDROXYZINE HCL 25 MG PO TABS: 25.0000 mg | ORAL_TABLET | Freq: Four times a day (QID) | ORAL | Status: DC | PRN

## 2014-09-20 MED ORDER — FLUOXETINE HCL 20 MG PO CAPS: 20.0000 mg | ORAL_CAPSULE | Freq: Every day | ORAL | Status: DC

## 2014-09-20 NOTE — Progress Notes (Signed)
[facility] [phone] Progress Note  Cheryl Noble [id number] 32 y.o.  09/20/2014 9:56 AM  Chief Complaint: been doing great  History of Present Illness: Denies depression.  Things are good at home and work. Sleep, appetite and energy are good. Pt exercised 3x/week. Denies isolation, crying spells, anhedonias, worthlessness and hopelessness.   Anxiety still present but meds help. It often centered around going out or going to work.Pt doesn't trust anyone.  Pt avoids crowds whenever possible. Pt takes Vistaril 1 hr before going out. It helps to decrease her anxiety level and allows her to do what she needs to do. Denies panic attacks. Anxiety does not interfere with sleep, cause fatigue and GI upset.   PTSD- overall well controlled and no concerns at this time. Denies nightmares, intrusive memories and avoidance.   Takes Prozac and Vistaril as prescribed and denise SE.   Suicidal Ideation: No Plan Formed: No Patient has means to carry out plan: No  Homicidal Ideation: No Plan Formed: No Patient has means to carry out plan: No  Review of Systems: Psychiatric: Agitation: No Hallucination: No Depressed Mood: No Insomnia: No Hypersomnia: No Altered Concentration: No Feels Worthless: No Grandiose Ideas: No Belief In Special Powers: No New/Increased Substance Abuse: No Compulsions: No  Neurologic: Headache: No Seizure: No Paresthesias: No   Review of Systems  Constitutional: Negative for fever and chills.  HENT: Negative for congestion, nosebleeds and sore throat.   Eyes: Negative for blurred vision, double vision and redness.  Respiratory: Negative for cough, sputum production and shortness of breath.   Cardiovascular: Positive for chest pain. Negative for palpitations and leg swelling.  Gastrointestinal: Negative for heartburn, nausea, vomiting, abdominal pain, diarrhea and constipation.  Musculoskeletal: Negative for myalgias, back pain and neck pain.  Skin:  Negative for itching and rash.  Neurological: Negative for dizziness, tingling, sensory change, seizures, weakness and headaches.  Psychiatric/Behavioral: Negative for depression, suicidal ideas, hallucinations and substance abuse. The patient is nervous/anxious. The patient does not have insomnia.      Past Medical Family, Social History:Pt lives alone/  Pt works as a Comptrollerlibrarian. 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   reports that she quit smoking about 2 weeks ago. Her smoking use included E-cigarettes. She smoked 0.00 packs per day. She has never used smokeless tobacco. She reports that she drinks about 1.8 oz of alcohol per week. She reports that she does not use illicit drugs. Family History  Problem Relation Age of Onset  . Bipolar disorder Neg Hx   . Depression Neg Hx   . Dementia Neg Hx    Past Medical History  Diagnosis Date  . Depression   . Anxiety     social anxiety    Outpatient Encounter Prescriptions as of 09/20/2014  Medication Sig  . FLUoxetine (PROZAC) 20 MG capsule Take 1 capsule (20 mg total) by mouth daily.  . hydrOXYzine (ATARAX/VISTARIL) 10 MG tablet Take 1 tablet (10 mg total) by mouth every 6 (six) hours as needed for anxiety.    Past Psychiatric History/Hospitalization(s): Anxiety: Yes Bipolar Disorder: No Depression: Yes Mania: No Psychosis: No Schizophrenia: No Personality Disorder: No Hospitalization for psychiatric illness: Yes History of Electroconvulsive Shock Therapy: No Prior Suicide Attempts: Yes  Physical Exam: Constitutional:  BP 158/106 mmHg  Pulse 72  Ht 5\' 6"  (1.676 m)  Wt 235 lb 6.4 oz (106.777 kg)  BMI 38.01 kg/m2  General Appearance: alert, oriented, no acute distress and well nourished  Musculoskeletal: Strength & Muscle Tone: within  normal limits Gait & Station: normal Patient leans: N/A  Mental Status Examination/Evaluation: Objective: Attitude: Calm and cooperative  Appearance: Fairly Groomed, appears to  be stated age  Eye Contact::  Good  Speech:  Clear and Coherent and Normal Rate  Volume:  Normal  Mood:  euthymic  Affect:  Full Range  Thought Process:  Goal Directed, Linear and Logical  Orientation:  Full (Time, Place, and Person)  Thought Content:  Negative  Suicidal Thoughts:  No  Homicidal Thoughts:  No  Judgement:  Good  Insight:  Good  Concentration: good  Memory: Immediate-good Recent-good Remote-good  Recall: fair  Language: fair  Gait and Station: normal  [employer] of Knowledge: average  Psychomotor Activity:  Normal  Akathisia:  No  Handed:  Right  AIMS (if indicated): n/a  Assets:  Manufacturing systems engineerCommunication Skills Desire for Improvement Financial Resources/Insurance Housing Intimacy Leisure Time Physical Health Resilience Social Support TEFL teacherTalents/Skills Transportation       Medical Decision Making (Choose Three): Established Problem, Stable/Improving (1), Review of Psycho-Social Stressors (1) and Review of Medication Regimen & Side Effects (2)  Assessment: Axis I: MDD- recurrent, 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 Depression   . Anxiety     social anxiety     Axis IV: limited coping skills  Axis V: GAF 70   Plan: Continue Prozac 20 mg po QD for mood and anxiety Hydroxyzine 25mg  QID prn anxiety  Medication management with supportive therapy. Risks/benefits and SE of the medication discussed. Pt verbalized understanding and verbal consent obtained for treatment.  Affirm with the patient that the medications are taken as ordered. Patient expressed understanding of how their medications were to be used.  -improvement of symptoms  Labs: none at this time  Therapy: brief supportive therapy provided. Discussed psychosocial stressors in detail.    Pt denies SI and is at an acute low risk for suicide.Patient told to call clinic if any problems occur.  Patient advised to go to ER if they should develop SI/HI, side effects, or if symptoms worsen. Has crisis numbers to call if needed. Pt verbalized understanding.  F/up in 3 months or sooner if needed  Oletta DarterAGARWAL, Kessler Solly, MD 09/20/2014

## 2014-12-22 ENCOUNTER — Ambulatory Visit (INDEPENDENT_AMBULATORY_CARE_PROVIDER_SITE_OTHER): Payer: 59 | Admitting: Psychiatry

## 2014-12-22 ENCOUNTER — Encounter (HOSPITAL_COMMUNITY): Payer: Self-pay | Admitting: Psychiatry

## 2014-12-22 ENCOUNTER — Ambulatory Visit (HOSPITAL_COMMUNITY): Payer: Self-pay | Admitting: Psychiatry

## 2014-12-22 VITALS — BP 141/105 | HR 87 | Ht 65.5 in | Wt 248.2 lb

## 2014-12-22 DIAGNOSIS — F332 Major depressive disorder, recurrent severe without psychotic features: Secondary | ICD-10-CM

## 2014-12-22 DIAGNOSIS — F331 Major depressive disorder, recurrent, moderate: Secondary | ICD-10-CM

## 2014-12-22 DIAGNOSIS — F418 Other specified anxiety disorders: Secondary | ICD-10-CM

## 2014-12-22 DIAGNOSIS — F4010 Social phobia, unspecified: Secondary | ICD-10-CM

## 2014-12-22 DIAGNOSIS — F4310 Post-traumatic stress disorder, unspecified: Secondary | ICD-10-CM

## 2014-12-22 MED ORDER — FLUOXETINE HCL 40 MG PO CAPS: 40.0000 mg | ORAL_CAPSULE | Freq: Every day | ORAL | Status: DC

## 2014-12-22 MED ORDER — HYDROXYZINE HCL 25 MG PO TABS: 25.0000 mg | ORAL_TABLET | Freq: Four times a day (QID) | ORAL | Status: DC | PRN

## 2014-12-22 NOTE — Progress Notes (Signed)
[facility] [id number] Progress Note  Cheryl Noble [id number] 33 y.o.  12/22/2014 10:35 AM  Chief Complaint: concentration is bad  History of Present Illness: States concentration is getting worse for last 5 months or so. Reports she loses track in conversations (almost daily) or forgetting why she is going to a room (rarely). No problems at work or retaining information while reading.   Pt is having increased stressed due to moving with her friend. She felt down twice for a few hours in the last 3 months. On those days she felt sad and cried a lot. Appetite is increased.  Sleep and energy are good. Pt exercised 3x/week. Denies isolation, crying spells, anhedonias, worthlessness and hopelessness for the most part.   Anxiety is present but managable. It often centered around going out or going to work. Pt doesn't trust anyone.  Pt doesn't like crowds but doesn't avoid it as much. Pt takes Vistaril 1 hr before going out and before going to work. It helps to decrease her anxiety level and allows her to do what she needs to do. Denies panic attacks. Anxiety does not interfere with sleep, cause fatigue and GI upset.   PTSD- overall well controlled and no concerns at this time. States she doesn't notice it. Denies nightmares, intrusive memories and avoidance.   Takes Prozac and Vistaril as prescribed and denise SE.   Suicidal Ideation: No Plan Formed: No Patient has means to carry out plan: No  Homicidal Ideation: No Plan Formed: No Patient has means to carry out plan: No  Review of Systems: Psychiatric: Agitation: No Hallucination: No Depressed Mood: No Insomnia: No Hypersomnia: No Altered Concentration: Yes Feels Worthless: No Grandiose Ideas: No Belief In Special Powers: No New/Increased Substance Abuse: No Compulsions: No  Neurologic: Headache: No Seizure: No Paresthesias: No   Review of Systems  Constitutional: Negative for fever, chills and weight loss.  HENT:  Negative for congestion and sore throat.   Eyes: Negative for blurred vision, double vision and redness.  Respiratory: Negative for cough, shortness of breath and wheezing.   Cardiovascular: Positive for palpitations. Negative for chest pain and leg swelling.  Gastrointestinal: Negative for heartburn, nausea, vomiting and abdominal pain.  Musculoskeletal: Positive for back pain. Negative for myalgias, joint pain and neck pain.  Skin: Negative for itching and rash.  Neurological: Negative for speech change, focal weakness, seizures, loss of consciousness and headaches.  Psychiatric/Behavioral: Negative for depression, suicidal ideas, hallucinations and substance abuse. The patient is nervous/anxious. The patient does not have insomnia.      Past Medical Family, Social History:  Pt works as a Comptrollerlibrarian. 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 Living with girlfriend.   reports that she quit smoking about 3 months ago. Her smoking use included E-cigarettes. She smoked 0.00 packs per day. She has never used smokeless tobacco. She reports that she drinks about 1.8 oz of alcohol per week. She reports that she does not use illicit drugs. Family History  Problem Relation Age of Onset  . Bipolar disorder Neg Hx   . Depression Neg Hx   . Dementia Neg Hx    Past Medical History  Diagnosis Date  . Depression   . Anxiety     social anxiety  . Hypertension     Outpatient Encounter Prescriptions as of 12/22/2014  Medication Sig  . amLODipine (NORVASC) 5 MG tablet Take 5 mg by mouth daily.  Marland Kitchen. FLUoxetine (PROZAC) 20 MG capsule Take 1 capsule (20 mg total) by mouth  daily.  . hydrOXYzine (ATARAX/VISTARIL) 25 MG tablet Take 1 tablet (25 mg total) by mouth every 6 (six) hours as needed for anxiety.    Past Psychiatric History/Hospitalization(s): Anxiety: Yes Bipolar Disorder: No Depression: Yes Mania: No Psychosis: No Schizophrenia: No Personality Disorder: No Hospitalization for  psychiatric illness: Yes History of Electroconvulsive Shock Therapy: No Prior Suicide Attempts: Yes  Physical Exam: Constitutional:  BP 141/105 mmHg  Pulse 87  Ht 5' 5.5" (1.664 m)  Wt 248 lb 3.2 oz (112.583 kg)  BMI 40.66 kg/m2  General Appearance: alert, oriented, no acute distress and well nourished  Musculoskeletal: Strength & Muscle Tone: within normal limits Gait & Station: normal Patient leans: N/A  Mental Status Examination/Evaluation: Objective: Attitude: Calm and cooperative  Appearance: Fairly Groomed, appears to be stated age  Eye Contact::  Good  Speech:  Clear and Coherent and Normal Rate  Volume:  Normal  Mood:  anxious  Affect:  Congruent  Thought Process:  Goal Directed, Linear and Logical  Orientation:  Full (Time, Place, and Person)  Thought Content:  Negative  Suicidal Thoughts:  No  Homicidal Thoughts:  No  Judgement:  Good  Insight:  Good  Concentration: good  Memory: Immediate-good Recent-good Remote-good  Recall: fair  Language: fair  Gait and Station: normal  [employer] of Knowledge: average  Psychomotor Activity:  Normal  Akathisia:  No  Handed:  Right  AIMS (if indicated): n/a  Assets:  Manufacturing systems engineer Desire for Improvement Financial Resources/Insurance Housing Intimacy Leisure Time Physical Health Resilience Social Support TEFL teacher (Choose Three): Established Problem, Stable/Improving (1), Review of Psycho-Social Stressors (1), Established Problem, Worsening (2), Review of Medication Regimen & Side Effects (2) and Review of New Medication or Change in Dosage (2)  Assessment: Axis I: MDD- recurrent, 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 Depression   . Anxiety     social anxiety  . Hypertension      Axis IV: limited coping skills  Axis V: GAF 70   Plan:  Increase Prozac to 40 mg po QD for mood and anxiety Hydroxyzine  QID prn anxiety  Medication management with supportive therapy. Risks/benefits and SE of the medication discussed. Pt verbalized understanding and verbal consent obtained for treatment.  Affirm with the patient that the medications are taken as ordered. Patient expressed understanding of how their medications were to be used.  -improvement of depression symptoms but worsening of anxiety. Concentration issues could be SE of Vistaril. Pt wants to continue for now. May consider Propanolol at next visit.   Labs: none at this time  Therapy: brief supportive therapy provided. Discussed psychosocial stressors in detail.    Pt denies SI and is at an acute low risk for suicide.Patient told to call clinic if any problems occur. Patient advised to go to ER if they should develop SI/HI, side effects, or if symptoms worsen. Has crisis numbers to call if needed. Pt verbalized understanding.  F/up in 3 months or sooner if needed  Oletta Darter, MD 12/22/2014

## 2015-01-13 ENCOUNTER — Telehealth (HOSPITAL_COMMUNITY): Payer: Self-pay

## 2015-01-13 NOTE — Telephone Encounter (Signed)
Patient called with report she was recently increased to Prozac 40mg  per day by Dr. Michae KavaAgarwal and that her PCP changed her over to Propranolol, not sure of dosage for her "heart and blood pressure".  Stated she stopped taking her prescribed Amlodipine but since changes had been feeling "more depressed and tired".  Patient reported she was scheduled to work today from 4-9pm, Saturday from 8:30a-6:00pm and Sunday from 1:30p-6:00p at [employer] [place] and requests advice from Dr. Michae KavaAgarwal and possible note to miss work this weekend due to feeling she just needs "to get caught up on sleep and to help with my depression".  Informed patient Dr. Michae KavaAgarwal only works on Tuesdays and Thursdays and is not here today but will send a note to see if she provides any response today but informed could not guarantee this currently.  Patient not suicidal or having homicidal ideations currently but just reports more depressed and tired since increase in Prozac and changes in medications.  Discussed FMLA potential for patient to see as needed and option and agreed to attempt to contact Dr. Michae KavaAgarwal for feedback today.  Patient requests a possible note to excuse her from work this weekend if she "just cannot handle it".  Patient stated her boss allowed her to leave work on 01/12/15 early due to crying on the job.

## 2015-01-16 NOTE — Telephone Encounter (Signed)
Telephone call with Dr. Michae KavaAgarwal late Friday evening 01/13/15 to inform of patient's request to be excused from work for the coming weekend due to recent changes in medications, increased crying spells and reported depression.  Informed patient denied any suicidal or homicidal ideations but reported her emotions had been fluctuating more since medication changes by Dr. Michae KavaAgarwal and change to propranolol by her primary care provider.  Dr. Michae KavaAgarwal reported recently seeing patient and would not authorize taking her out of work with such short notice and no current evaluation.  Agreed to inform patient she could make an appointment to be seen earlier to come in and discuss with patient.  Attempted to contact patient back that Friday night but no answer.  Left a voice message with instruction on 01/15/15 and requested patient call the office to come in for an earlier evaluation and to discuss work with Dr. Michae KavaAgarwal.  Informed Dr. Michae KavaAgarwal I had also instructed the patient on possible FMLA assistance she could file for with her job.

## 2015-01-17 ENCOUNTER — Ambulatory Visit (INDEPENDENT_AMBULATORY_CARE_PROVIDER_SITE_OTHER): Payer: 59 | Admitting: Psychiatry

## 2015-01-17 ENCOUNTER — Encounter (HOSPITAL_COMMUNITY): Payer: Self-pay | Admitting: Psychiatry

## 2015-01-17 VITALS — BP 147/101 | HR 59 | Ht 65.5 in | Wt 248.0 lb

## 2015-01-17 DIAGNOSIS — F331 Major depressive disorder, recurrent, moderate: Secondary | ICD-10-CM

## 2015-01-17 DIAGNOSIS — F4310 Post-traumatic stress disorder, unspecified: Secondary | ICD-10-CM

## 2015-01-17 DIAGNOSIS — F418 Other specified anxiety disorders: Secondary | ICD-10-CM

## 2015-01-17 DIAGNOSIS — F4010 Social phobia, unspecified: Secondary | ICD-10-CM

## 2015-01-17 DIAGNOSIS — F332 Major depressive disorder, recurrent severe without psychotic features: Secondary | ICD-10-CM

## 2015-01-17 MED ORDER — FLUOXETINE HCL 20 MG PO CAPS: 20.0000 mg | ORAL_CAPSULE | Freq: Every day | ORAL | Status: DC

## 2015-01-17 NOTE — Telephone Encounter (Signed)
Patient came in today for her appointment

## 2015-01-17 NOTE — Progress Notes (Signed)
[facility] [id number] Progress Note  Cheryl Lambertracy Yoss [id number] 34 y.o.  01/17/2015 9:27 AM  Chief Complaint: not sleeping since increasing Prozac  History of Present Illness: Pt is getting about 3 hrs of poor sleep since dose of Prozac was increased. Also noticing decrease in libido and sad mood. It began a couple of days after dose of Prozac was increased.  She has been having daily sad mood and irritability. It seems mostly related to work. Over the weekend she was having on/off crying spells and some anhedonia. Denies isolation and worthlessness. Appetite is increased and energy is low. She is exercising about 1-2x/week. Concentration has improved since stopping Vistaril.   Feels she is more stressed and depressed. She is having more crying spells. On Thursday she was felt overwhelmed and depressed and asked to leave work early. Pt did not go in on Friday thru Sunday. Pt was able to sleep and relax over the weekend. Pt went back to work on Monday and it was ok.  Pt was able to sleep all thru the night last night after taking Prozac in the morning vs midday.  Anxiety is present but managable and might be a little decreased It often centered around going out or going to work. Pt doesn't trust anyone.  Pt doesn't like crowds but doesn't avoid it as much.. Denies panic attacks. Anxiety does not interfere with sleep, cause fatigue and GI upset. Pt has been taking Propanolol for 1 week and it helps a little with anxiety.   PTSD- overall well controlled and no concerns at this time. States she doesn't notice it and hasn't thought about it for years. Denies nightmares, intrusive memories and avoidance.   Takes Prozac as prescribed and endorsing SE of low libido.   Suicidal Ideation: No Plan Formed: No Patient has means to carry out plan: No  Homicidal Ideation: No Plan Formed: No Patient has means to carry out plan: No  Review of Systems: Psychiatric: Agitation: No Hallucination:  No Depressed Mood: Yes Insomnia: Yes Hypersomnia: No Altered Concentration: Yes Feels Worthless: No Grandiose Ideas: No Belief In Special Powers: No New/Increased Substance Abuse: No Compulsions: No  Neurologic: Headache: Yes Seizure: No Paresthesias: No   Review of Systems  Constitutional: Negative for fever, chills and weight loss.  HENT: Negative for congestion, nosebleeds and sore throat.   Eyes: Negative for blurred vision, double vision and redness.  Respiratory: Negative for cough, shortness of breath and wheezing.   Cardiovascular: Negative for chest pain, palpitations and leg swelling.  Gastrointestinal: Negative for heartburn, nausea, vomiting and abdominal pain.  Musculoskeletal: Positive for back pain. Negative for myalgias, joint pain and neck pain.  Skin: Negative for itching and rash.  Neurological: Positive for dizziness and headaches. Negative for focal weakness, seizures, loss of consciousness and weakness.  Psychiatric/Behavioral: Positive for depression. Negative for suicidal ideas, hallucinations and substance abuse. The patient is nervous/anxious and has insomnia.      Past Medical Family, Social History:  Pt works as a Comptrollerlibrarian. 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 Living with girlfriend.   reports that she quit smoking about 4 months ago. Her smoking use included E-cigarettes. She smoked 0.00 packs per day. She has never used smokeless tobacco. She reports that she drinks about 1.8 oz of alcohol per week. She reports that she does not use illicit drugs. Family History  Problem Relation Age of Onset  . Bipolar disorder Neg Hx   . Depression Neg Hx   . Dementia Neg  Hx    Past Medical History  Diagnosis Date  . Depression   . Anxiety     social anxiety  . Hypertension     Outpatient Encounter Prescriptions as of 01/17/2015  Medication Sig  . amLODipine (NORVASC) 5 MG tablet Take 5 mg by mouth daily.  Marland Kitchen FLUoxetine (PROZAC) 40 MG capsule  Take 1 capsule (40 mg total) by mouth daily.  . propranolol (INDERAL) 10 MG tablet Take 10 mg by mouth 2 (two) times daily.  . hydrOXYzine (ATARAX/VISTARIL) 25 MG tablet Take 1 tablet (25 mg total) by mouth every 6 (six) hours as needed for anxiety. (Patient not taking: Reported on 01/17/2015)    Past Psychiatric History/Hospitalization(s): Anxiety: Yes Bipolar Disorder: No Depression: Yes Mania: No Psychosis: No Schizophrenia: No Personality Disorder: No Hospitalization for psychiatric illness: Yes History of Electroconvulsive Shock Therapy: No Prior Suicide Attempts: Yes  Physical Exam: Constitutional:  BP 147/101 mmHg  Pulse 59  Ht 5' 5.5" (1.664 m)  Wt 248 lb (112.492 kg)  BMI 40.63 kg/m2  General Appearance: alert, oriented, no acute distress and well nourished  Musculoskeletal: Strength & Muscle Tone: within normal limits Gait & Station: normal Patient leans: N/A  Mental Status Examination/Evaluation: Objective: Attitude: Calm and cooperative  Appearance: Fairly Groomed, appears to be stated age  Eye Contact::  Good  Speech:  Clear and Coherent and Normal Rate  Volume:  Normal  Mood: depressed  Affect:  Congruent  Thought Process:  Goal Directed, Linear and Logical  Orientation:  Full (Time, Place, and Person)  Thought Content:  Negative  Suicidal Thoughts:  No  Homicidal Thoughts:  No  Judgement:  Good  Insight:  Good  Concentration: good  Memory: Immediate-good Recent-good Remote-good  Recall: fair  Language: fair  Gait and Station: normal  [employer] of Knowledge: average  Psychomotor Activity:  Normal  Akathisia:  No  Handed:  Right  AIMS (if indicated): n/a  Assets:  Manufacturing systems engineer Desire for Improvement Financial Resources/Insurance Housing Intimacy Leisure Time Physical Health Resilience Social Support TEFL teacher (Choose Three): Established Problem, Stable/Improving (1),  Review of Psycho-Social Stressors (1), Established Problem, Worsening (2), Review of Medication Regimen & Side Effects (2) and Review of New Medication or Change in Dosage (2)  Assessment: Axis I: MDD- recurrent, 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 Depression   . Anxiety     social anxiety  . Hypertension    Axis IV: limited coping skills  Axis V: GAF 70   Plan: decrease Prozac to 20 mg po qD for mood and anxiety. If no improvement then will consider treatment with SNRI D/c Vistaril Propanolol as prescibed by PCP for anxiety  Medication management with supportive therapy. Risks/benefits and SE of the medication discussed. Pt verbalized understanding and verbal consent obtained for treatment.  Affirm with the patient that the medications are taken as ordered. Patient expressed understanding of how their medications were to be used.  -improvement of anxiety symptoms but worsening of depression.   Labs: none at this time  Therapy: brief supportive therapy provided. Discussed psychosocial stressors in detail.    Pt denies SI and is at an acute low risk for suicide.Patient told to call clinic if any problems occur. Patient advised to go to ER if they should develop SI/HI, side effects, or if symptoms worsen. Has crisis numbers to  call if needed. Pt verbalized understanding.  F/up in 2 months or sooner if needed.  Pt asking for letter  January 17, 2015  Patient:  Cheryl Noble Date of Birth:  [birth year] Date of Visit:  01/17/2015  To Whom it Noble Concern:  Cheryl Noble was evaluated on 01/17/2015 by Dr. Oletta Darter.  Pt was last seen on 12/22/2014 and that time her medications were adjusted. Ms. Cheryl Noble reported changes in her sleep which could have been related to the medication. She reports sleep changes affected her overall performance. Today we have addressed these concerns and  She   Noble return to work on 01/17/2015 without restrictions.  If you require additional information, please contact [phone] to obtain written authorization to release protected health information.  [employer], building services do not permit release of information without signed authorization.  Cheryl Noble Noble stop by the hospital to complete an Authorization to Release Protected Health Information.  Thank you for your cooperation in this regard.  If you have any questions or concerns, please don't hesitate to call.  Sincerely,    Dr. Ardeth Sportsman, MD 01/17/2015

## 2015-03-23 ENCOUNTER — Encounter (HOSPITAL_COMMUNITY): Payer: Self-pay | Admitting: Psychiatry

## 2015-03-23 ENCOUNTER — Ambulatory Visit (HOSPITAL_COMMUNITY): Payer: Self-pay | Admitting: Psychiatry

## 2015-03-23 ENCOUNTER — Ambulatory Visit (INDEPENDENT_AMBULATORY_CARE_PROVIDER_SITE_OTHER): Payer: 59 | Admitting: Psychiatry

## 2015-03-23 ENCOUNTER — Telehealth (HOSPITAL_COMMUNITY): Payer: Self-pay | Admitting: *Deleted

## 2015-03-23 VITALS — BP 144/89 | HR 75 | Ht 65.5 in | Wt 256.6 lb

## 2015-03-23 DIAGNOSIS — F332 Major depressive disorder, recurrent severe without psychotic features: Secondary | ICD-10-CM

## 2015-03-23 DIAGNOSIS — F4310 Post-traumatic stress disorder, unspecified: Secondary | ICD-10-CM

## 2015-03-23 DIAGNOSIS — F331 Major depressive disorder, recurrent, moderate: Secondary | ICD-10-CM

## 2015-03-23 DIAGNOSIS — F4010 Social phobia, unspecified: Secondary | ICD-10-CM

## 2015-03-23 DIAGNOSIS — F418 Other specified anxiety disorders: Secondary | ICD-10-CM | POA: Diagnosis not present

## 2015-03-23 MED ORDER — FLUOXETINE HCL 20 MG PO CAPS: 20.0000 mg | ORAL_CAPSULE | Freq: Every day | ORAL | Status: DC

## 2015-03-23 NOTE — Telephone Encounter (Signed)
Dr. Michae KavaAgarwal,   Patient called.  Patient missed appointment today.  Patient r/s'd for 03-28-15 and then r/s'd for 03-30-15. I did discuss with patient the importance of keeping appointments for medication management.  Patient request refill of Prozac. Patient is completely out. Is it okay to refill Prozac, enough to make it to her appointment 03-30-15? Please advise.

## 2015-03-23 NOTE — Telephone Encounter (Signed)
Patient notified RX was sent to her pharmacy. Patient stated she will come this afternoon for her appointment, able to get later appointment.

## 2015-03-23 NOTE — Progress Notes (Signed)
[facility] [id number] Progress Note  Cheryl Noble [id number] 33 y.o.  03/23/2015 3:38 PM  Chief Complaint: good  History of Present Illness: Pt recently moved to [place] with her girlfriend.   Pt is getting about 8 hrs of sleep per night. Energy and concentration are good. Pt is doing PT for her knee. Appetite is increased.   Pt denies depression and irritability. She only stressors are work. Denies anhedonia, crying spells, low motivation, worthlessness and hopelessness.   Anxiety is present but manageable. Pt doesn't trust anyone.  Pt doesn't like crowds but continues to avoid certain situations. Pt had one stress induced panic attack after getting a new job offer. Anxiety does not interfere with sleep, cause fatigue and GI upset. Pt has been taking Propanolol once a day and it helps a little with anxiety. Pt will take one tab extra in the evening if she is going out.   PTSD- overall well controlled and no concerns at this time. States she doesn't notice it and hasn't thought about it for years. Denies nightmares, intrusive memories and avoidance.   Takes Prozac as prescribed and endorsing SE of low libido.   Suicidal Ideation: No Plan Formed: No Patient has means to carry out plan: No  Homicidal Ideation: No Plan Formed: No Patient has means to carry out plan: No  Review of Systems: Psychiatric: Agitation: No Hallucination: No Depressed Mood: No Insomnia: No Hypersomnia: No Altered Concentration: No Feels Worthless: No Grandiose Ideas: No Belief In Special Powers: No New/Increased Substance Abuse: No Compulsions: No  Neurologic: Headache: No Seizure: No Paresthesias: No   Review of Systems  Constitutional: Negative for fever, chills and weight loss.  HENT: Negative for congestion, ear pain, nosebleeds, sore throat and tinnitus.   Eyes: Negative for blurred vision, double vision and pain.  Respiratory: Negative for cough, sputum production and shortness  of breath.   Cardiovascular: Positive for chest pain. Negative for palpitations and leg swelling.  Gastrointestinal: Negative for heartburn, nausea, vomiting and abdominal pain.  Musculoskeletal: Positive for back pain. Negative for joint pain and neck pain.  Skin: Negative for itching and rash.  Neurological: Negative for dizziness, tremors, sensory change, seizures, loss of consciousness and headaches.  Psychiatric/Behavioral: Negative for depression, suicidal ideas, hallucinations and substance abuse. The patient is nervous/anxious. The patient does not have insomnia.      Past Medical Family, Social History:  Pt works as a Comptrollerlibrarian. 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 Living with girlfriend.   reports that she quit smoking about 6 months ago. Her smoking use included E-cigarettes. She smoked 0.00 packs per day. She has never used smokeless tobacco. She reports that she drinks about 1.8 oz of alcohol per week. She reports that she does not use illicit drugs. Family History  Problem Relation Age of Onset  . Bipolar disorder Neg Hx   . Depression Neg Hx   . Dementia Neg Hx    Past Medical History  Diagnosis Date  . Depression   . Anxiety     social anxiety  . Hypertension     Outpatient Encounter Prescriptions as of 03/23/2015  Medication Sig  . amLODipine (NORVASC) 5 MG tablet Take 5 mg by mouth daily.  Marland Kitchen. FLUoxetine (PROZAC) 20 MG capsule Take 1 capsule (20 mg total) by mouth daily.  . propranolol (INDERAL) 10 MG tablet Take 10 mg by mouth 2 (two) times daily.   No facility-administered encounter medications on file as of 03/23/2015.    Past  Psychiatric History/Hospitalization(s): Anxiety: Yes Bipolar Disorder: No Depression: Yes Mania: No Psychosis: No Schizophrenia: No Personality Disorder: No Hospitalization for psychiatric illness: Yes History of Electroconvulsive Shock Therapy: No Prior Suicide Attempts: Yes  Physical Exam: Constitutional:  BP 144/89  mmHg  Pulse 75  Ht 5' 5.5" (1.664 m)  Wt 256 lb 9.6 oz (116.393 kg)  BMI 42.04 kg/m2  General Appearance: alert, oriented, no acute distress and well nourished  Musculoskeletal: Strength & Muscle Tone: within normal limits Gait & Station: normal Patient leans: N/A  Mental Status Examination/Evaluation: Objective: Attitude: Calm and cooperative  Appearance: Fairly Groomed, appears to be stated age  Eye Contact::  Good  Speech:  Clear and Coherent and Normal Rate  Volume:  Normal  Mood: euthymic  Affect:  Full Range  Thought Process:  Goal Directed, Linear and Logical  Orientation:  Full (Time, Place, and Person)  Thought Content:  Negative  Suicidal Thoughts:  No  Homicidal Thoughts:  No  Judgement:  Good  Insight:  Good  Concentration: good  Memory: Immediate-good Recent-good Remote-good  Recall: fair  Language: fair  Gait and Station: normal  [employer] of Knowledge: average  Psychomotor Activity:  Normal  Akathisia:  No  Handed:  Right  AIMS (if indicated): n/a  Assets:  Manufacturing systems engineerCommunication Skills Desire for Improvement Financial Resources/Insurance Housing Intimacy Leisure Time Physical Health Resilience Social Support TEFL teacherTalents/Skills Transportation       Medical Decision Making (Choose Three): Established Problem, Stable/Improving (1), Review of Psycho-Social Stressors (1) and Review of Medication Regimen & Side Effects (2)  Assessment: Axis I: MDD- recurrent, 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 Depression   . Anxiety     social anxiety  . Hypertension    Axis IV: limited coping skills  Axis V: GAF 70   Plan:  Prozac to 20 mg po qD for mood and anxiety. If no improvement then will consider treatment with SNRI. Pt unable to tolerate higher doses -for low libido pt is going to try skipping a dose or two before planned intimacy -if ineffective  talked about augmenting with low dose of Remeron or Wellbutrin Propanolol as prescibed by PCP for anxiety  Medication management with supportive therapy. Risks/benefits and SE of the medication discussed. Pt verbalized understanding and verbal consent obtained for treatment.  Affirm with the patient that the medications are taken as ordered. Patient expressed understanding of how their medications were to be used.  -improvement of symptoms  Labs: none at this time  Therapy: brief supportive therapy provided. Discussed psychosocial stressors in detail.    Pt denies SI and is at an acute low risk for suicide.Patient told to call clinic if any problems occur. Patient advised to go to ER if they should develop SI/HI, side effects, or if symptoms worsen. Has crisis numbers to call if needed. Pt verbalized understanding.  F/up in 6 months or sooner if needed.   Oletta DarterAGARWAL, Yazleen Molock, MD 03/23/2015

## 2015-03-23 NOTE — Telephone Encounter (Signed)
Yes we can refill this one time. If she no shows then we will not refill again until she is evaluated.

## 2015-03-28 ENCOUNTER — Ambulatory Visit (HOSPITAL_COMMUNITY): Payer: Self-pay | Admitting: Psychiatry

## 2015-03-30 ENCOUNTER — Ambulatory Visit (HOSPITAL_COMMUNITY): Payer: Self-pay | Admitting: Psychiatry

## 2015-08-29 ENCOUNTER — Encounter: Payer: Self-pay | Admitting: Cardiovascular Disease

## 2015-08-29 ENCOUNTER — Ambulatory Visit (INDEPENDENT_AMBULATORY_CARE_PROVIDER_SITE_OTHER): Payer: Commercial Managed Care - HMO | Admitting: Cardiovascular Disease

## 2015-08-29 VITALS — BP 153/96 | HR 78 | Ht 65.0 in | Wt 268.2 lb

## 2015-08-29 DIAGNOSIS — R072 Precordial pain: Secondary | ICD-10-CM | POA: Diagnosis not present

## 2015-08-29 DIAGNOSIS — R002 Palpitations: Secondary | ICD-10-CM | POA: Diagnosis not present

## 2015-08-29 DIAGNOSIS — I10 Essential (primary) hypertension: Secondary | ICD-10-CM | POA: Diagnosis not present

## 2015-08-29 MED ORDER — HYDROCHLOROTHIAZIDE 12.5 MG PO CAPS: 12.5000 mg | ORAL_CAPSULE | Freq: Every day | ORAL | Status: AC

## 2015-08-29 MED ORDER — HYDROCHLOROTHIAZIDE 25 MG PO TABS: 25.0000 mg | ORAL_TABLET | Freq: Every day | ORAL | Status: DC

## 2015-08-29 NOTE — Patient Instructions (Addendum)
START HCTZ  12.5 MG BY MOUTH ONE TABLET  A DAY  LABS - TSH,T4 FREE, CBC,CMP   Your physician has requested that you have an exercise tolerance test. For further information please visit [email or web address] Please also follow instruction sheet, as given.  Your physician has recommended that you wear a holter monitor 48 HOURS - WILL HAVE TO SCHEDULE. Holter monitors are medical devices that record the heart's electrical activity. Doctors most often use these monitors to diagnose arrhythmias. Arrhythmias are problems with the speed or rhythm of the heartbeat. The monitor is a small, portable device. You can wear one while you do your normal daily activities. This is usually used to diagnose what is causing palpitations/syncope (passing out).  CHEST AND ABDOMEN Non-Cardiac CT scanning, (CAT scanning), is a noninvasive, special x-ray that produces cross-sectional images of the body using x-rays and a computer. CT scans help physicians diagnose and treat medical conditions. For some CT exams, a contrast material is used to enhance visibility in the area of the body being studied. CT scans provide greater clarity and reveal more details than regular x-ray exams.    Your physician recommends that you schedule a follow-up appointment in 3 MONTHS WITH DR [facility].

## 2015-08-29 NOTE — Progress Notes (Signed)
Cardiology Office Note   Date:  08/29/2015   ID:  Cheryl Noble, DOB [birth year], MRN [id number]  PCP:  Massie Maroon, FNP  Cardiologist:   Madilyn Hook, MD   Chief Complaint  Patient presents with  . Advice Only    patient will sometimes hace chest discomfort thst will wake her up at night. patient has history of high blood pressure.      History of Present Illness: Cheryl Noble is a 34 y.o. female with obesity and anxiety who presents for an evaluation of chest pain and management of her hypertension.  She was initially diagnosed with hypertension one year ago and was initially started on amlodipine.  She developed LE edema and was switched to propranolol.  She requested referral to cardiology for further management.  Cheryl Noble also reports chest tightness with exercise.  This occurs every time she exercises and has been ongoing for approximately one year.  She denies shortness of breath, nausea, palpitations, lightheadedness or dizziness when this occurs.  The pain is sharp and 3/10 in severity.  At times it is worse with exertion, but she also notes it sometime at rest.  It does not limit her ability to finish exercise.  She likes to play tennis or basketball for 2 hours at a time 1-2 times per week.  Cheryl Noble also notes paplitations that occur most commonly at night and in the early AM.  At times it wakes her from sleep and she has a hard time falling back away.  She denies associated shortness of breath but does get lightheaded.  The episodes lat for 10-15 minutes at a time and are not associated with chest pain.  She occasionally has nausea and diaphoresis but it only occurs when she has a migraine.  Cheryl Noble previously smoked for 5 years but quit in 2014.  She has been trying unsuccessfully to lose weight.  She ties to eat a healthy diet but continues to gain weight.  She is concerned that this may be due to her anti-depression medication. She admits that she  should eat more vegetables.  Past Medical History  Diagnosis Date  . Depression   . Anxiety     social anxiety  . Hypertension     Past Surgical History  Procedure Laterality Date  . Cholecystectomy       Current Outpatient Prescriptions  Medication Sig Dispense Refill  . FLUoxetine (PROZAC) 20 MG capsule Take 1 capsule (20 mg total) by mouth daily. 30 capsule 5  . propranolol (INDERAL) 10 MG tablet Take 10 mg by mouth 2 (two) times daily.    . hydrochlorothiazide (MICROZIDE) 12.5 MG capsule Take 1 capsule (12.5 mg total) by mouth daily. 30 capsule 6   No current facility-administered medications for this visit.    Allergies:   Review of patient's allergies indicates no known allergies.    Social History:  The patient  reports that she quit smoking about a year ago. Her smoking use included E-cigarettes. She smoked 0.00 packs per day. She has never used smokeless tobacco. She reports that she drinks about 1.8 oz of alcohol per week. She reports that she does not use illicit drugs.   Family History:  The patient's family history includes Diabetes in her maternal grandmother; Heart failure in her maternal grandmother; Hypertension in her maternal grandmother and mother. There is no history of Bipolar disorder, Depression, or Dementia.    ROS:  Please see the history of present illness.  Otherwise, review of systems are positive for none.   All other systems are reviewed and negative.    PHYSICAL EXAM: VS:  BP 153/96 mmHg  Pulse 78  Ht  (1.651 m)  Wt 121.655 kg (268 lb 3.2 oz)  BMI 44.63 kg/m2 , BMI Body mass index is 44.63 kg/(m^2). GENERAL:  Well appearing HEENT:  Pupils equal round and reactive, fundi not visualized, oral mucosa unremarkable NECK:  No jugular venous distention, waveform within normal limits, carotid upstroke brisk and symmetric, no bruits, no thyromegaly LYMPHATICS:  No cervical adenopathy LUNGS:  Clear to auscultation bilaterally HEART:  RRR.   PMI not displaced or sustained,S1 and S2 within normal limits, no S3, no S4, no clicks, no rubs, no murmurs CHEST: 2cm x3cm fixed, indurated nodule inferior to the sternum.  Non-tender. ABD:  Flat, positive bowel sounds normal in frequency in pitch, no bruits, no rebound, no guarding, no midline pulsatile mass, no hepatomegaly, no splenomegaly EXT:  2 plus pulses throughout, no edema, no cyanosis no clubbing SKIN:  No rashes no nodules NEURO:  Cranial nerves II through XII grossly intact, motor grossly intact throughout PSYCH:  Cognitively intact, oriented to person place and time    EKG:  EKG is ordered today. The ekg ordered today demonstrates sinus rhyhtm at 78 bpm.  Non-specific t wave changes.   Recent Labs: No results found for requested labs within last 365 days.    Lipid Panel No results found for: CHOL, TRIG, HDL, CHOLHDL, VLDL, LDLCALC, LDLDIRECT   11/6107 TSH 1.3 Chol  150, ri 97, dhd 41, ldl 90  Wt Readings from Last 3 Encounters:  08/29/15 121.655 kg (268 lb 3.2 oz)  03/23/15 116.393 kg (256 lb 9.6 oz)  01/17/15 112.492 kg (248 lb)      ASSESSMENT AND PLAN:  # Hypertension: BP is poorly-controlled.  She did not tolerate amlodipine.  It seems that a diuretic may be a good next step.  Propranolol at the current dose will not manage her hypertension, but may help her anxiety.  Therefore, we will not stop it at this time. - Start HCTZ 12.5 mg daily  # Chest pain: Symptoms are atypical and her only risk factor is hypertension.  Given her age and ability to exercise for hours, it is extremely unlikely that she has obstructive coronary disease.  However, she has limited her exercise due to these symptoms.  Therefore, we will pursue a treadmill stress test to rule out obstructive coronary disease and encourage her to increase the frequency of her exercise to at least 30-40 minutes most days of the week.  # Palpitations: Cheryl Noble has daily palpitations.  We will obtain a  48 hour Holter and CBC, Chem 7, TSH, fT4 to assess for an etiology of her symptoms.  # Chest wall nodule: Cheryl Noble has a fixed subcutaneous nodule that has been growing.  She is concerned about what it could be.  It does no appear to be infectious and is inderated.  It does not seem like a lipoma.  - CT chest/abd to evaluate nodule  Current medicines are reviewed at length with the patient today.  The patient does not have concerns regarding medicines.  The following changes have been made:  Start HCTZ 12.5 mg daily  Labs/ tests ordered today include:   Orders Placed This Encounter  Procedures  . CT Abd Wo & W Cm  . CT Chest W Contrast  . T4, free  . TSH  . Comprehensive  metabolic panel  . CBC  . Exercise Tolerance Test  . Holter monitor - 48 hour  . EKG 12-Lead     Disposition:   FU with Cheryl Oshea C. Duke Salvia, MD in 3 months   Signed, Madilyn Hook, MD  08/29/2015 7:59 PM    [facility]

## 2015-08-31 LAB — CBC
HCT: 37 % (ref 36.0–46.0)
HEMOGLOBIN: 12 g/dL (ref 12.0–15.0)
MCH: 27.7 pg (ref 26.0–34.0)
MCHC: 32.4 g/dL (ref 30.0–36.0)
MCV: 85.5 fL (ref 78.0–100.0)
MPV: 11.9 fL (ref 8.6–12.4)
Platelets: 220 10*3/uL (ref 150–400)
RBC: 4.33 MIL/uL (ref 3.87–5.11)
RDW: 15.1 % (ref 11.5–15.5)
WBC: 7 10*3/uL (ref 4.0–10.5)

## 2015-08-31 LAB — COMPREHENSIVE METABOLIC PANEL
ALK PHOS: 47 U/L (ref 33–115)
ALT: 16 U/L (ref 6–29)
AST: 20 U/L (ref 10–30)
Albumin: 3.6 g/dL (ref 3.6–5.1)
BUN: 8 mg/dL (ref 7–25)
CALCIUM: 8.9 mg/dL (ref 8.6–10.2)
CHLORIDE: 101 mmol/L (ref 98–110)
CO2: 28 mmol/L (ref 20–31)
Creat: 0.69 mg/dL (ref 0.50–1.10)
GLUCOSE: 85 mg/dL (ref 65–99)
POTASSIUM: 4.2 mmol/L (ref 3.5–5.3)
Sodium: 135 mmol/L (ref 135–146)
Total Bilirubin: 0.3 mg/dL (ref 0.2–1.2)
Total Protein: 6.7 g/dL (ref 6.1–8.1)

## 2015-08-31 LAB — TSH: TSH: 1.514 u[IU]/mL (ref 0.350–4.500)

## 2015-08-31 LAB — T4, FREE: Free T4: 1.05 ng/dL (ref 0.80–1.80)

## 2015-09-01 ENCOUNTER — Telehealth: Payer: Self-pay | Admitting: *Deleted

## 2015-09-01 NOTE — Telephone Encounter (Signed)
-----   Message from Chilton Siiffany Whitmer, MD sent at 08/31/2015 11:07 PM EDT ----- Normal labs.

## 2015-09-01 NOTE — Telephone Encounter (Signed)
LEFT MESSAGE TO CALL BACK  RELEASE TO MY CHART  

## 2015-09-01 NOTE — Telephone Encounter (Signed)
Spoke to patient. Result given . Verbalized understanding  

## 2015-09-07 ENCOUNTER — Ambulatory Visit (INDEPENDENT_AMBULATORY_CARE_PROVIDER_SITE_OTHER)
Admission: RE | Admit: 2015-09-07 | Discharge: 2015-09-07 | Disposition: A | Discharge status: Home / Self Care | Payer: Commercial Managed Care - HMO | Source: Ambulatory Visit | Attending: Cardiovascular Disease | Admitting: Cardiovascular Disease

## 2015-09-07 DIAGNOSIS — R072 Precordial pain: Secondary | ICD-10-CM | POA: Diagnosis not present

## 2015-09-07 DIAGNOSIS — R002 Palpitations: Secondary | ICD-10-CM

## 2015-09-07 MED ORDER — IOHEXOL 300 MG/ML  SOLN
80.0000 mL | Freq: Once | INTRAMUSCULAR | Status: AC | PRN
  Administered 2015-09-07: 80 mL via INTRAVENOUS

## 2015-09-11 ENCOUNTER — Telehealth: Payer: Self-pay | Admitting: Cardiovascular Disease

## 2015-09-11 NOTE — Telephone Encounter (Signed)
Patient called and notified of results.  °

## 2015-09-11 NOTE — Telephone Encounter (Signed)
Pt would like her CT results from 09-07-15 please.

## 2015-09-19 ENCOUNTER — Encounter (HOSPITAL_COMMUNITY): Payer: Self-pay | Admitting: Psychiatry

## 2015-09-19 ENCOUNTER — Ambulatory Visit (INDEPENDENT_AMBULATORY_CARE_PROVIDER_SITE_OTHER): Payer: 59 | Admitting: Psychiatry

## 2015-09-19 VITALS — BP 131/68 | HR 98 | Ht 65.5 in | Wt 272.0 lb

## 2015-09-19 DIAGNOSIS — F4310 Post-traumatic stress disorder, unspecified: Secondary | ICD-10-CM | POA: Diagnosis not present

## 2015-09-19 DIAGNOSIS — F331 Major depressive disorder, recurrent, moderate: Secondary | ICD-10-CM | POA: Diagnosis not present

## 2015-09-19 DIAGNOSIS — F4010 Social phobia, unspecified: Secondary | ICD-10-CM | POA: Diagnosis not present

## 2015-09-19 MED ORDER — FLUOXETINE HCL 20 MG PO CAPS: 20.0000 mg | ORAL_CAPSULE | Freq: Every day | ORAL | Status: AC

## 2015-09-19 MED ORDER — BUPROPION HCL ER (XL) 150 MG PO TB24
150.0000 mg | ORAL_TABLET | Freq: Every day | ORAL | Status: DC
Start: 2015-09-19 — End: 2015-11-02

## 2015-09-19 MED ORDER — CLONAZEPAM 1 MG PO TABS: 1.0000 mg | ORAL_TABLET | Freq: Every day | ORAL | Status: DC | PRN

## 2015-09-19 NOTE — Progress Notes (Signed)
Patient ID: Cheryl Noble, female   DOB: 1981-11-12, 34 y.o.   MRN: [id number]  [facility] [id number] Progress Note  Cheryl Noble [id number] 34 y.o.  09/19/2015 8:57 AM  Chief Complaint: anxiety is high  History of Present Illness: Pt may be moving out of [place] with her partner soon depending on her job situation. Pt is having anxiety about the unknown and wondering how pt will support herself financially in the long run.   Pt has fear that her boss is discussing pt's personal business with others. Pt went to HR with her concern. Pt then spoke with Armed forces technical officer agreed to stop talking about the pt. This has caused a lot of mistrust in others and anxiety. Pt doesn't want to to go to staff meetings. Pt doesn't trust anyone.  Pt doesn't like crowds but continues to avoid certain situations. Pt had one panic attack while in a crowd. Pt is no longer taking Propanolol at the suggestion of her cardiologist.   Pt is getting about 8 hrs of sleep per night but it takes her hours to fall asleep. Energy and concentration are fair.  Appetite is increased and she is stress eating.   Pt is managing her depression and irritability. She only stressors are work, weight gain and moving. She feels down about twice a month.  Denies anhedonia, crying spells, low motivation, worthlessness and hopelessness.   PTSD- overall well controlled and no concerns at this time. States she doesn't notice it and hasn't thought about it for years. Denies nightmares, intrusive memories and avoidance.   Takes Prozac as prescribed and endorsing SE of low libido.   Suicidal Ideation: No Plan Formed: No Patient has means to carry out plan: No  Homicidal Ideation: No Plan Formed: No Patient has means to carry out plan: No  Review of Systems: Psychiatric: Agitation: No Hallucination: No Depressed Mood: No Insomnia: No Hypersomnia: No Altered Concentration: No Feels Worthless: No Grandiose Ideas:  No Belief In Special Powers: No New/Increased Substance Abuse: No Compulsions: No  Neurologic: Headache: No Seizure: No Paresthesias: No   Review of Systems  Constitutional: Negative for fever, chills and weight loss.  HENT: Negative for congestion, ear pain, nosebleeds, sore throat and tinnitus.   Eyes: Negative for blurred vision, double vision and pain.  Respiratory: Negative for cough, sputum production and shortness of breath.   Cardiovascular: Positive for chest pain. Negative for palpitations and leg swelling.  Gastrointestinal: Negative for heartburn, nausea, vomiting and abdominal pain.  Musculoskeletal: Positive for back pain. Negative for joint pain and neck pain.  Skin: Negative for itching and rash.  Neurological: Negative for dizziness, tremors, sensory change, seizures, loss of consciousness and headaches.  Psychiatric/Behavioral: Negative for depression, suicidal ideas, hallucinations and substance abuse. The patient is nervous/anxious. The patient does not have insomnia.      Past Medical Family, Social History:  Pt works as a Comptroller. 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 Living with girlfriend.   reports that she quit smoking about a year ago. Her smoking use included E-cigarettes. She smoked 0.00 packs per day. She has never used smokeless tobacco. She reports that she drinks about 1.8 oz of alcohol per week. She reports that she does not use illicit drugs. Family History  Problem Relation Age of Onset  . Bipolar disorder Neg Hx   . Depression Neg Hx   . Dementia Neg Hx   . Hypertension Mother   . Hypertension Maternal Grandmother   . Heart  failure Maternal Grandmother   . Diabetes Maternal Grandmother    Past Medical History  Diagnosis Date  . Depression   . Anxiety     social anxiety  . Hypertension     Outpatient Encounter Prescriptions as of 09/19/2015  Medication Sig  . FLUoxetine (PROZAC) 20 MG capsule Take 1 capsule (20 mg total)  by mouth daily.  . hydrochlorothiazide (MICROZIDE) 12.5 MG capsule Take 1 capsule (12.5 mg total) by mouth daily.  . propranolol (INDERAL) 10 MG tablet Take 10 mg by mouth 2 (two) times daily.   No facility-administered encounter medications on file as of 09/19/2015.    Past Psychiatric History/Hospitalization(s): Anxiety: Yes Bipolar Disorder: No Depression: Yes Mania: No Psychosis: No Schizophrenia: No Personality Disorder: No Hospitalization for psychiatric illness: Yes History of Electroconvulsive Shock Therapy: No Prior Suicide Attempts: Yes  Physical Exam: Constitutional:  BP 131/68 mmHg  Pulse 98  Ht 5' 5.5" (1.664 m)  Wt 272 lb (123.378 kg)  BMI 44.56 kg/m2  LMP 08/31/2015  General Appearance: alert, oriented, no acute distress and well nourished  Musculoskeletal: Strength & Muscle Tone: within normal limits Gait & Station: normal Patient leans: N/A  Mental Status Examination/Evaluation: Objective: Attitude: Calm and cooperative  Appearance: Fairly Groomed, appears to be stated age  Eye Contact::  Good  Speech:  Clear and Coherent and Normal Rate  Volume:  Normal  Mood: anxious   Affect:  Congruent  Thought Process:  Goal Directed, Linear and Logical  Orientation:  Full (Time, Place, and Person)  Thought Content:  Negative  Suicidal Thoughts:  No  Homicidal Thoughts:  No  Judgement:  Good  Insight:  Good  Concentration: good  Memory: Immediate-good Recent-good Remote-good  Recall: fair  Language: fair  Gait and Station: normal  [employer] of Knowledge: average  Psychomotor Activity:  Normal  Akathisia:  No  Handed:  Right  AIMS (if indicated): n/a  Assets:  Manufacturing systems engineer Desire for Improvement Financial Resources/Insurance Housing Intimacy Leisure Time Physical Health Resilience Social Support TEFL teacher (Choose Three): Review of Psycho-Social Stressors (1), Review or order  clinical lab tests (1), Established Problem, Worsening (2), Review of Medication Regimen & Side Effects (2) and Review of New Medication or Change in Dosage (2)  Assessment: Axis I: MDD- recurrent, 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 Depression   . Anxiety     social anxiety  . Hypertension    Axis IV: limited coping skills  Axis V: GAF 70   Plan:  Prozac to 20 mg po qD for mood and anxiety. If no improvement then will consider treatment with SNRI. Pt unable to tolerate higher doses -for low libido pt is going to try skipping a dose or two before planned intimacy -start trial Wellbutrin XL  po qD for mood augmentation -start trial of Klonopin  po qD prn anxiety given #15tabs with no refills - no longer taking Propanolol as prescibed by PCP for anxiety  Medication management with supportive therapy. Risks/benefits and SE of the medication discussed. Pt verbalized understanding and verbal consent obtained for treatment.  Affirm with the patient that the medications are taken as ordered. Patient expressed understanding of how their medications were to be used.    Labs: 08/29/2015 CMP and CBC and TSH WNL  Therapy: brief supportive therapy provided. Discussed psychosocial stressors in detail.  Pt denies SI and is at an acute low risk for suicide.Patient told to call clinic if any problems occur. Patient advised to go to ER if they should develop SI/HI, side effects, or if symptoms worsen. Has crisis numbers to call if needed. Pt verbalized understanding.  F/up in 2 months or sooner if needed.   Oletta DarterAGARWAL, Jlon Betker, MD 09/19/2015

## 2015-09-28 ENCOUNTER — Telehealth (HOSPITAL_COMMUNITY): Payer: Self-pay

## 2015-09-28 NOTE — Telephone Encounter (Signed)
Encounter complete. 

## 2015-09-29 ENCOUNTER — Encounter (HOSPITAL_COMMUNITY): Payer: Self-pay

## 2015-10-03 ENCOUNTER — Encounter (INDEPENDENT_AMBULATORY_CARE_PROVIDER_SITE_OTHER): Payer: Commercial Managed Care - HMO

## 2015-10-03 ENCOUNTER — Ambulatory Visit (HOSPITAL_COMMUNITY)
Admission: RE | Admit: 2015-10-03 | Discharge: 2015-10-03 | Disposition: A | Discharge status: Home / Self Care | Payer: Commercial Managed Care - HMO | Source: Ambulatory Visit | Attending: Cardiovascular Disease | Admitting: Cardiovascular Disease

## 2015-10-03 DIAGNOSIS — R002 Palpitations: Secondary | ICD-10-CM

## 2015-10-03 DIAGNOSIS — R072 Precordial pain: Secondary | ICD-10-CM | POA: Insufficient documentation

## 2015-10-03 LAB — EXERCISE TOLERANCE TEST
CHL CUP MPHR: 187 {beats}/min
CSEPEDS: 15 s
CSEPEW: 8.9 METS
CSEPPHR: 173 {beats}/min
Exercise duration (min): 7 min
Percent HR: 93 %
RPE: 17
Rest HR: 89 {beats}/min

## 2015-10-04 ENCOUNTER — Telehealth: Payer: Self-pay | Admitting: *Deleted

## 2015-10-04 NOTE — Telephone Encounter (Signed)
LEFT MESSAGE ON VOICE MAIL

## 2015-10-04 NOTE — Telephone Encounter (Signed)
-----   Message from Tiffany [place], MD sent at 10/04/2015  8:02 AM EST ----- Normal stress test. 

## 2015-10-05 NOTE — Telephone Encounter (Signed)
Spoke to patient. Result given . Verbalized understanding  patient had question can she bring monitor into tomorrow. Informed patient it will be okay. Verbalized understanding

## 2015-10-10 ENCOUNTER — Telehealth: Payer: Self-pay | Admitting: *Deleted

## 2015-10-10 NOTE — Telephone Encounter (Signed)
Left message to call back Release to my chart 

## 2015-10-10 NOTE — Telephone Encounter (Signed)
-----   Message from Chilton Siiffany [place], MD sent at 10/10/2015  3:28 PM EST ----- Monitor showed occasional early beats from the top chamber of the heart.  No dangerous rhythms.

## 2015-10-11 NOTE — Telephone Encounter (Signed)
Returning your call from yesterday. °

## 2015-10-11 NOTE — Telephone Encounter (Signed)
Left message on voice mail  to call back

## 2015-10-11 NOTE — Telephone Encounter (Signed)
Spoke to patient. Result given . Verbalized understanding  

## 2015-11-02 ENCOUNTER — Telehealth (HOSPITAL_COMMUNITY): Payer: Self-pay

## 2015-11-02 DIAGNOSIS — F4010 Social phobia, unspecified: Secondary | ICD-10-CM

## 2015-11-02 MED ORDER — CLONAZEPAM 1 MG PO TABS: 1.0000 mg | ORAL_TABLET | Freq: Every day | ORAL | Status: AC | PRN

## 2015-11-02 NOTE — Telephone Encounter (Signed)
Pt is moving to [place] in mid January. Pt is concerned about getting her meds refilled while awaiting a new psychiatrist. Pt is not sure if she can make her scheduled appt in Jan but will try.  Pt has not been taking Wellbutrin.  She has been taking Klonopin 1/2 tab daily. Pt reports she has 5 refills on her Prozac but would like a refill on her Klonopin. Pt was informed that she the clinic could provider one 30 day refill as she is moving out of state and will no longer follow up with us. Pt verbalized understanding and agreed to the plan.

## 2015-11-21 ENCOUNTER — Ambulatory Visit (HOSPITAL_COMMUNITY): Payer: Self-pay | Admitting: Psychiatry

## 2015-11-27 NOTE — Progress Notes (Signed)
This encounter was created in error - please disregard.

## 2015-11-28 ENCOUNTER — Telehealth: Payer: Self-pay | Admitting: Cardiovascular Disease

## 2015-11-28 NOTE — Telephone Encounter (Signed)
Patient called and said that she was moving out of state and wanted to know if it is ok to stay on her fluid pills OV note in October shows that she was started on HCTZ 12.5 mg a day RX for #30 for 6 refills Patient is going to stay on HCTZ giving her time to establish care with another provider where she is moving

## 2015-11-28 NOTE — Telephone Encounter (Signed)
Mrs.Ethier is moving out of state and wants to know will it be ok for her stay on her fluid pills (HCTZ) or do she need to be taking anything else  Please call   Thanks

## 2015-11-29 ENCOUNTER — Encounter: Payer: Self-pay | Admitting: Cardiovascular Disease

## 2015-11-29 NOTE — Telephone Encounter (Signed)
Yes, it will be a good idea to start HCTZ.  She will need to find a primary care doctor when she moves who can help manage her blood pressure. Best of luck to her in her move.

## 2015-11-30 NOTE — Telephone Encounter (Signed)
Spoke to patient. INFORMATION GIVEN .  Verbalized understanding    PAT[facility] THAN[facility] AND [facility]"

## 2016-06-10 IMAGING — CT CT CHEST W/ CM
3 of 4 series · 17 of 36 positions shown, 19 images · non-contrast
Comparison: None.

CLINICAL DATA: Palpitations .  Mid chest lump for 2-3 years.

EXAM:
CT CHEST AND ABDOMEN WITHOUT CONTRAST
TECHNIQUE: Multidetector CT imaging of the chest and abdomen was performed
following the standard protocol without intravenous contrast.

[Series 2: ch/abd with · axial · 0.79mm/px · z∈[-384,-138]mm · 6 of 84 slices shown]
[im 10/84  lung]
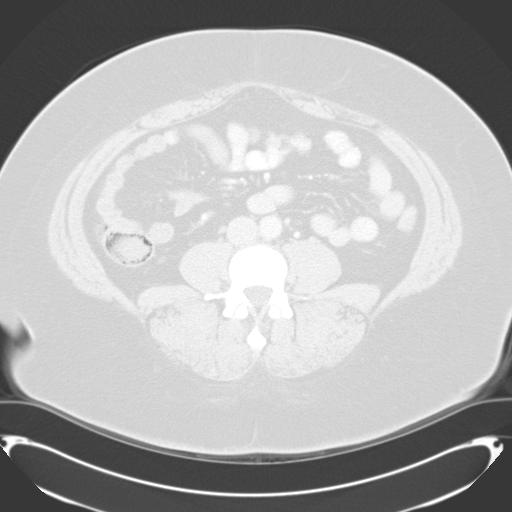
[im 20/84  lung]
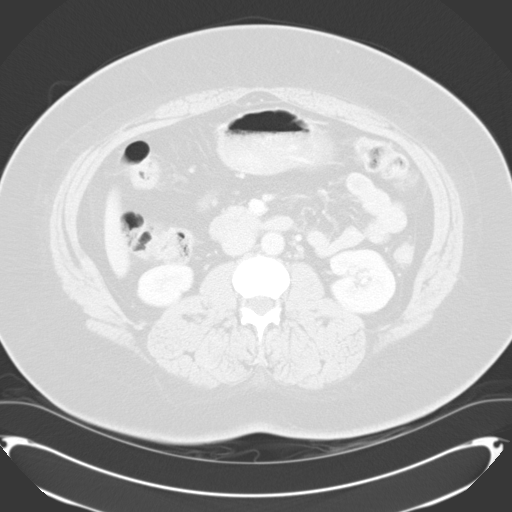
[im 30/84  lung]
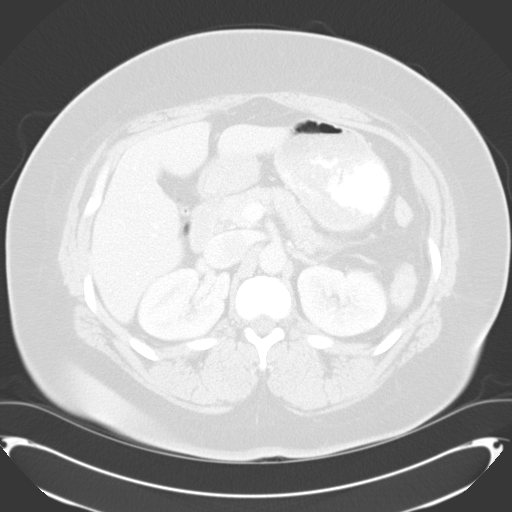
[im 40/84  lung]
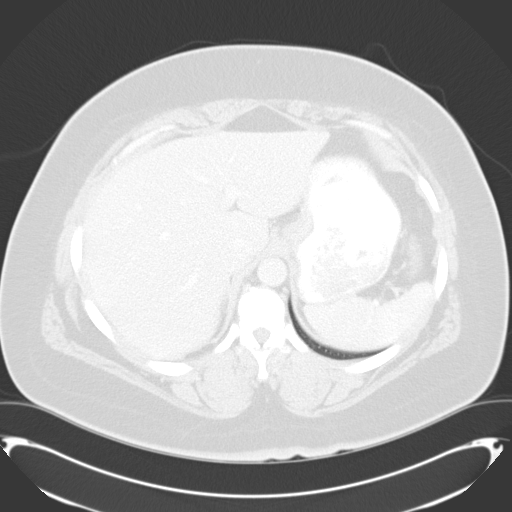
[im 49/84  lung]
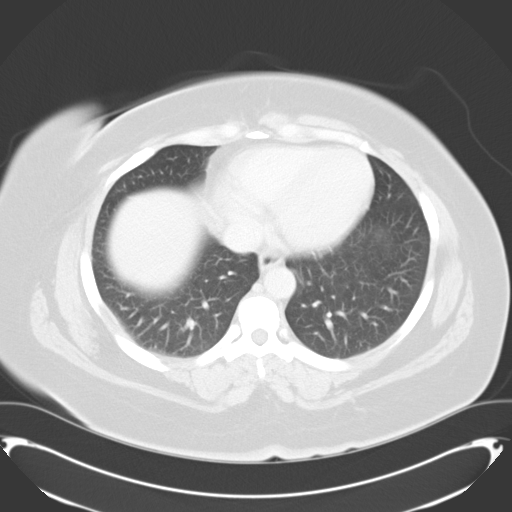
[im 59/84  lung]
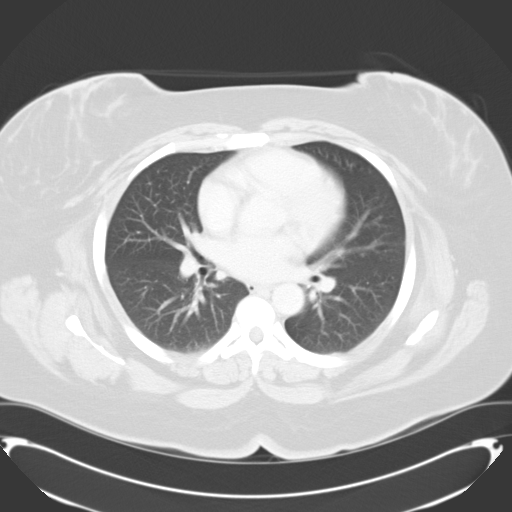

[Series 3: lung · axial · 0.79mm/px · z∈[-234,-44]mm · 8 of 50 slices shown, 10 images]
[im 6/50  mediastinal]
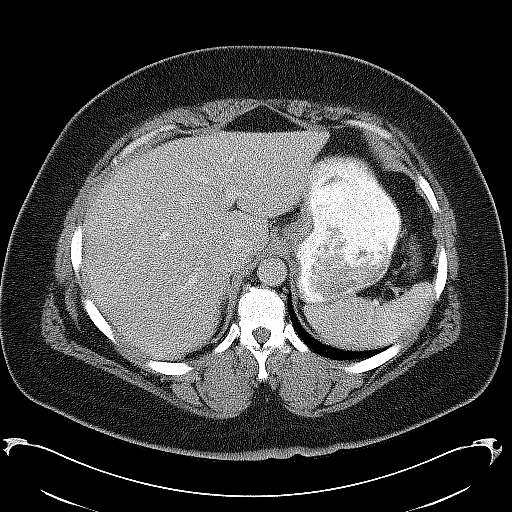
[im 6/50  lung]
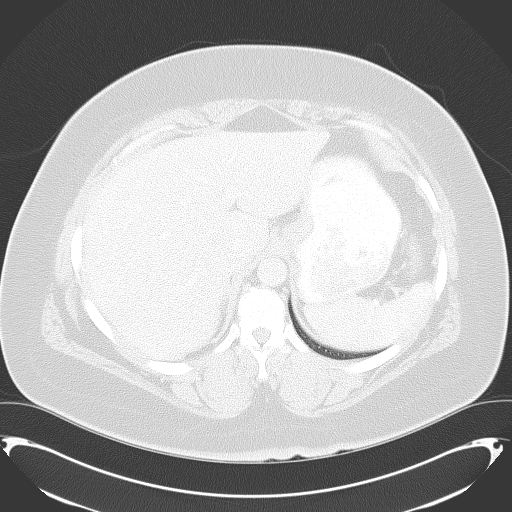
[im 11/50  lung]
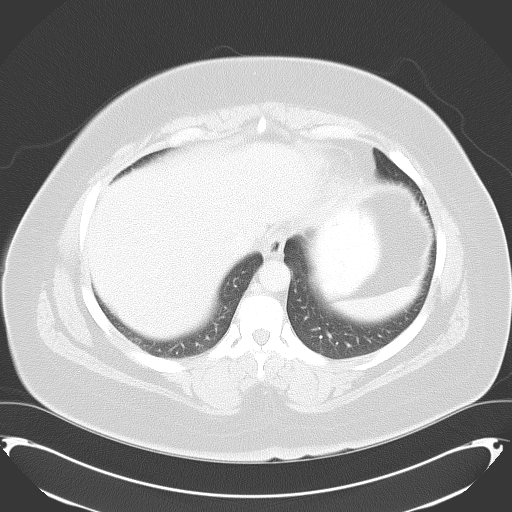
[im 17/50  lung]
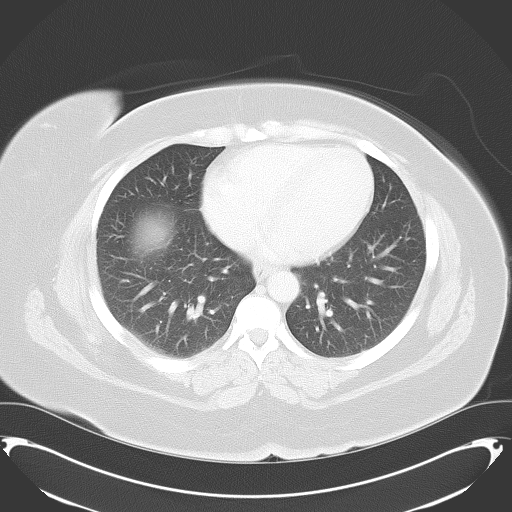
[im 22/50  lung]
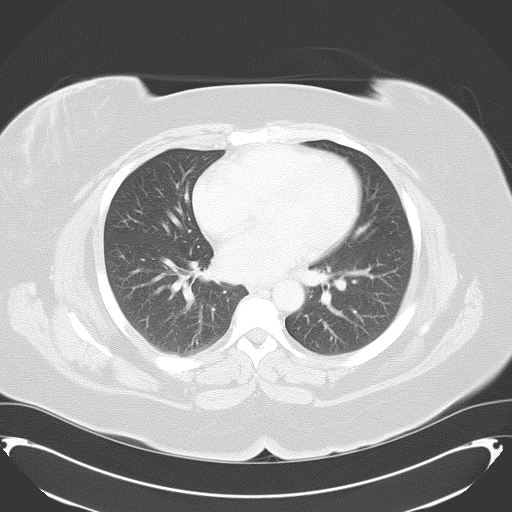
[im 28/50  mediastinal]
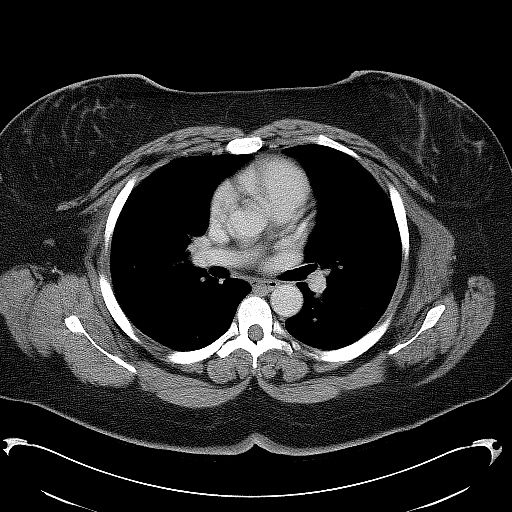
[im 28/50  lung]
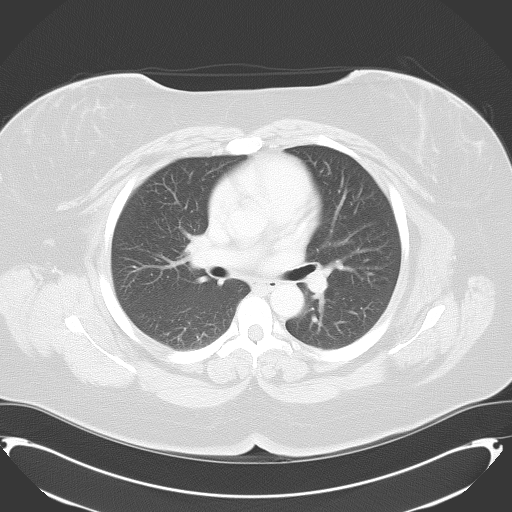
[im 33/50  lung]
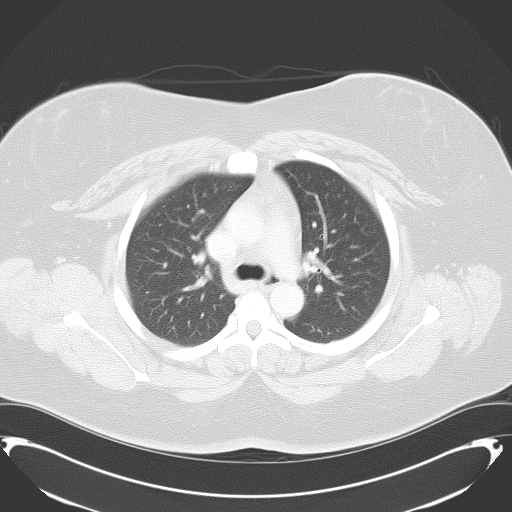
[im 39/50  lung]
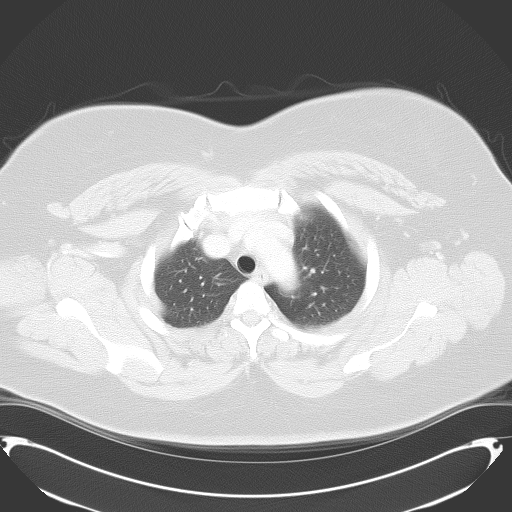
[im 44/50  lung]
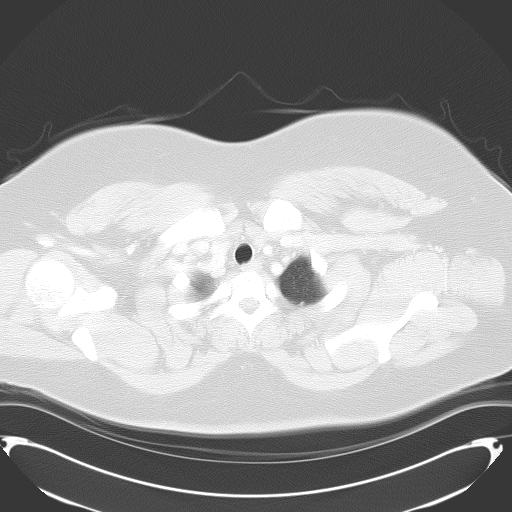

[Series 602: cor · coronal · 0.86mm/px · 3 of 153 slices shown]
[im 31/153  lung]
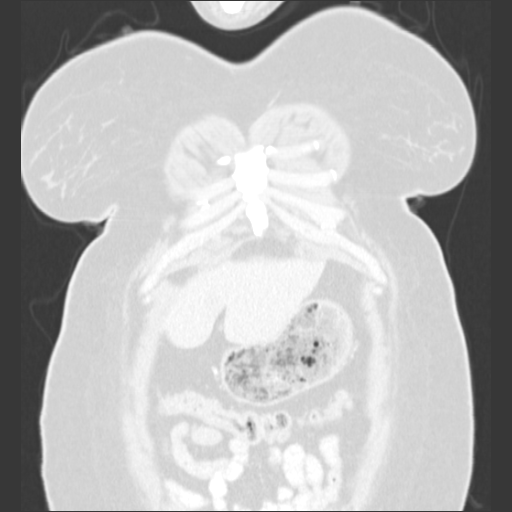
[im 61/153  lung]
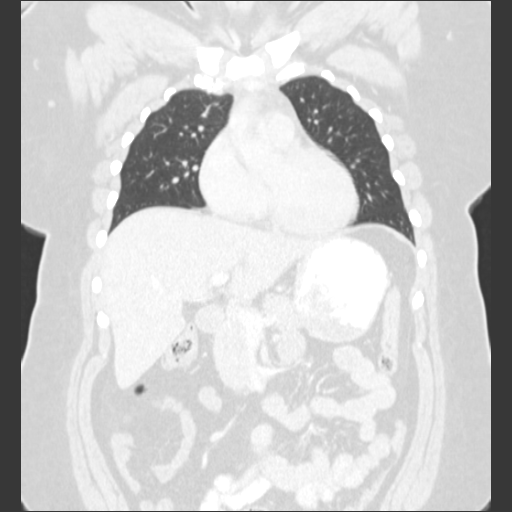
[im 92/153  lung]
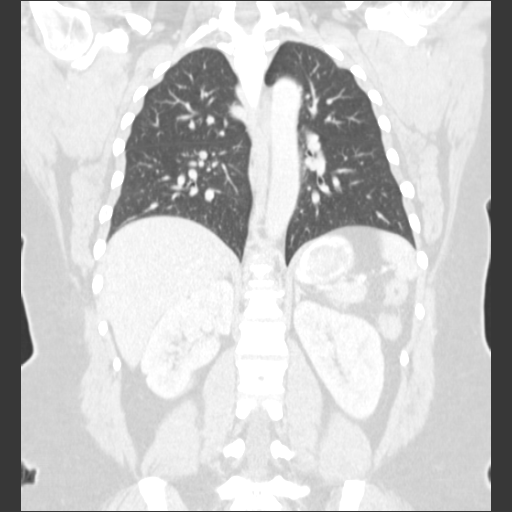

[17 of 36 positions shown; findings below may reference images not displayed]

FINDINGS: CT CHEST FINDINGS

THORACIC INLET/BODY WALL:

A BB was placed in the subxiphoid region on the anterior chest wall.
This area is free of mass and the palpable concern may be related to
outwardly convex but normal xiphoid process.

MEDIASTINUM:

Normal heart size. No pericardial effusion. No acute vascular
abnormality. No adenopathy.

LUNG WINDOWS:

No consolidation.  No effusion.  No suspicious pulmonary nodule.

OSSEOUS:

No fracture. No suspicious lytic or blastic lesions. Spondylosis at
the thoracolumbar junction.

CT ABDOMEN FINDINGS

Lower chest and abdominal wall:  No contributory findings.

Hepatobiliary: No focal liver abnormality.Cholecystectomy.

Pancreas: Unremarkable.

Spleen: Unremarkable.

Adrenals/Urinary Tract: Negative adrenals. No hydronephrosis or
stone.

Stomach/Bowel:  Visualized segments unremarkable.

Vascular/Lymphatic: No acute vascular abnormality. No mass or
adenopathy.

Peritoneal: No ascites or pneumoperitoneum.

Musculoskeletal: No acute abnormalities.
IMPRESSION: Negative CT of the chest and abdomen.  No chest wall mass.

## 2024-06-18 ENCOUNTER — Other Ambulatory Visit: Payer: Self-pay
